# Patient Record
Sex: Female | Born: 1940
Health system: Southern US, Community
[De-identification: ages and names within clinical notes are randomized; demographics above are authoritative.]

## PROBLEM LIST (undated history)

## (undated) DIAGNOSIS — I10 Essential (primary) hypertension: Secondary | ICD-10-CM

## (undated) DIAGNOSIS — K52831 Collagenous colitis: Secondary | ICD-10-CM

## (undated) DIAGNOSIS — C439 Malignant melanoma of skin, unspecified: Secondary | ICD-10-CM

## (undated) DIAGNOSIS — D039 Melanoma in situ, unspecified: Secondary | ICD-10-CM

## (undated) HISTORY — DX: Essential (primary) hypertension: I10

## (undated) HISTORY — DX: Malignant melanoma of skin, unspecified: C43.9

## (undated) HISTORY — PX: BREAST BIOPSY: SHX20

## (undated) HISTORY — DX: Collagenous colitis: K52.831

## (undated) HISTORY — DX: Melanoma in situ, unspecified: D03.9

## (undated) HISTORY — PX: MELANOMA EXCISION: SHX5266

## (undated) HISTORY — PX: TONSILLECTOMY: SUR1361

---

## 1978-07-25 HISTORY — PX: STRABISMUS SURGERY: SHX218

## 2003-07-26 HISTORY — PX: OTHER SURGICAL HISTORY: SHX169

## 2008-03-17 DIAGNOSIS — D039 Melanoma in situ, unspecified: Secondary | ICD-10-CM

## 2008-03-17 HISTORY — DX: Melanoma in situ, unspecified: D03.9

## 2008-08-04 DIAGNOSIS — C4491 Basal cell carcinoma of skin, unspecified: Secondary | ICD-10-CM

## 2008-08-04 HISTORY — DX: Basal cell carcinoma of skin, unspecified: C44.91

## 2013-08-29 DIAGNOSIS — D239 Other benign neoplasm of skin, unspecified: Secondary | ICD-10-CM

## 2013-08-29 HISTORY — DX: Other benign neoplasm of skin, unspecified: D23.9

## 2014-09-23 LAB — HM COLONOSCOPY

## 2015-01-23 HISTORY — PX: CARDIOVASCULAR STRESS TEST: SHX262

## 2016-03-23 ENCOUNTER — Encounter: Payer: Self-pay | Admitting: Internal Medicine

## 2016-03-23 ENCOUNTER — Ambulatory Visit (INDEPENDENT_AMBULATORY_CARE_PROVIDER_SITE_OTHER): Payer: Medicare PPO | Admitting: Internal Medicine

## 2016-03-23 VITALS — BP 160/98 | HR 71 | Temp 97.7°F | Ht 64.0 in | Wt 161.0 lb

## 2016-03-23 DIAGNOSIS — Z7189 Other specified counseling: Secondary | ICD-10-CM | POA: Insufficient documentation

## 2016-03-23 DIAGNOSIS — Z85820 Personal history of malignant melanoma of skin: Secondary | ICD-10-CM

## 2016-03-23 DIAGNOSIS — I10 Essential (primary) hypertension: Secondary | ICD-10-CM | POA: Insufficient documentation

## 2016-03-23 LAB — COMPREHENSIVE METABOLIC PANEL
ALK PHOS: 68 U/L (ref 39–117)
ALT: 24 U/L (ref 0–35)
AST: 27 U/L (ref 0–37)
Albumin: 4.6 g/dL (ref 3.5–5.2)
BILIRUBIN TOTAL: 0.9 mg/dL (ref 0.2–1.2)
BUN: 15 mg/dL (ref 6–23)
CO2: 30 meq/L (ref 19–32)
CREATININE: 0.59 mg/dL (ref 0.40–1.20)
Calcium: 10 mg/dL (ref 8.4–10.5)
Chloride: 92 mEq/L — ABNORMAL LOW (ref 96–112)
GFR: 105 mL/min (ref >60.00)
GLUCOSE: 96 mg/dL (ref 70–99)
Potassium: 4.1 mEq/L (ref 3.5–5.1)
Sodium: 128 mEq/L — ABNORMAL LOW (ref 135–145)
Total Protein: 8.1 g/dL (ref 6.0–8.3)

## 2016-03-23 LAB — CBC WITH DIFFERENTIAL/PLATELET
BASOS ABS: 0 10*3/uL (ref 0.0–0.1)
Basophils Relative: 0.4 % (ref 0.0–3.0)
EOS ABS: 0.1 10*3/uL (ref 0.0–0.7)
Eosinophils Relative: 1.3 % (ref 0.0–5.0)
HEMATOCRIT: 40.3 % (ref 36.0–46.0)
HEMOGLOBIN: 13.5 g/dL (ref 12.0–15.0)
LYMPHS PCT: 29.2 % (ref 12.0–46.0)
Lymphs Abs: 1.8 10*3/uL (ref 0.7–4.0)
MCHC: 33.6 g/dL (ref 30.0–36.0)
MCV: 87 fl (ref 78.0–100.0)
MONOS PCT: 13.4 % — AB (ref 3.0–12.0)
Monocytes Absolute: 0.8 10*3/uL (ref 0.1–1.0)
NEUTROS ABS: 3.4 10*3/uL (ref 1.4–7.7)
Neutrophils Relative %: 55.7 % (ref 43.0–77.0)
PLATELETS: 374 10*3/uL (ref 150.0–400.0)
RBC: 4.63 Mil/uL (ref 3.87–5.11)
RDW: 16 % — ABNORMAL HIGH (ref 11.5–15.5)
WBC: 6.1 10*3/uL (ref 4.0–10.5)

## 2016-03-23 LAB — LIPID PANEL
CHOL/HDL RATIO: 3
CHOLESTEROL: 261 mg/dL — AB (ref 0–200)
HDL: 102.7 mg/dL (ref >39.00)
LDL CALC: 144 mg/dL — AB (ref 0–99)
NonHDL: 158.12
TRIGLYCERIDES: 72 mg/dL (ref 0.0–149.0)
VLDL: 14.4 mg/dL (ref 0.0–40.0)

## 2016-03-23 LAB — MICROALBUMIN / CREATININE URINE RATIO
CREATININE, U: 30.2 mg/dL
MICROALB/CREAT RATIO: 7.6 mg/g (ref 0.0–30.0)
Microalb, Ur: 2.3 mg/dL — ABNORMAL HIGH (ref 0.0–1.9)

## 2016-03-23 LAB — T4, FREE: Free T4: 0.85 ng/dL (ref 0.60–1.60)

## 2016-03-23 MED ORDER — TETANUS-DIPHTHERIA TOXOIDS TD 5-2 LFU IM INJ: 0.5000 mL | INJECTION | Freq: Once | INTRAMUSCULAR | 0 refills | Status: AC

## 2016-03-23 MED ORDER — METOPROLOL SUCCINATE ER 25 MG PO TB24: 25.0000 mg | ORAL_TABLET | Freq: Every day | ORAL | 3 refills | Status: DC

## 2016-03-23 MED ORDER — AMLODIPINE BESYLATE 5 MG PO TABS: 5.0000 mg | ORAL_TABLET | Freq: Every day | ORAL | 3 refills | Status: DC

## 2016-03-23 MED ORDER — LOSARTAN POTASSIUM-HCTZ 100-25 MG PO TABS: 1.0000 | ORAL_TABLET | Freq: Every day | ORAL | 3 refills | Status: DC

## 2016-03-23 NOTE — Assessment & Plan Note (Signed)
Keeps up with dermatologist

## 2016-03-23 NOTE — Progress Notes (Signed)
Pre visit review using our clinic review tool, if applicable. No additional management support is needed unless otherwise documented below in the visit note. 

## 2016-03-23 NOTE — Progress Notes (Signed)
Subjective:    Patient ID: Barbara Murphy, female    DOB: Jul 24, 1941, 75 y.o.   MRN: [id number]  HPI Here with husband to establish Moved to [facility] a few months ago  Most concerning history is past melanoma Has had a few Keeps up with dermatologist regularly  Long standing HTN Usually well controlled BP usually fine--like when giving blood Feels head is full--congested  No current outpatient prescriptions on file prior to visit.   No current facility-administered medications on file prior to visit.     Allergies  Allergen Reactions  . Sulfa Antibiotics Hives    Past Medical History:  Diagnosis Date  . Hypertension   . Melanoma [facility])     Past Surgical History:  Procedure Laterality Date  . [place]   benign  . MELANOMA EXCISION     multiple--- [facility]  . TONSILLECTOMY      Family History  Problem Relation Age of Onset  . Cancer Mother     liver  . Parkinson's disease Father   . Diabetes Brother   . Heart disease Brother   . Stroke Sister   . Heart disease Sister     Social History   Social History  . Marital status: Married    Spouse name: N/A  . Number of children: 7  . Years of education: N/A   Occupational History  . 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 Smoking status: Former Smoker    Packs/day: 0.25    Types: Cigarettes    Quit date: 07/25/1968  . Smokeless tobacco: Never Used  . Alcohol use Not on file  . Drug use: Unknown  . Sexual activity: Not on file   Other Topics Concern  . Not on file   Social History Narrative   Second marriage-- 1980   2 children, 5 stepchildren   Multiple grandchildren and great grandchildren      Has living will   Husband has health care POA   Would accept resuscitation attempts   Would leave tube feeds up to husband   Review of Systems  Constitutional: Negative for fatigue and unexpected weight change.       Regular exercise  at [facility] Wears seat belt  HENT: Negative for dental problem and hearing loss.        Partial dentures Keeps up with dentist  Eyes: Negative for visual disturbance.       No diplopia or unilateral vision loss  Respiratory: Negative for cough, chest tightness and shortness of breath.   Cardiovascular: Negative for palpitations and leg swelling.       Chest pain last year--negative stress test, etc  Gastrointestinal: Negative for nausea and vomiting.       No heartburn Colitis spell ~12 years ago. (collagenous). occ diarrhea  Endocrine: Negative for polydipsia and polyuria.  Genitourinary: Negative for difficulty urinating, dysuria and hematuria.  Musculoskeletal: Negative for arthralgias, back pain and joint swelling.  Skin: Negative for rash.  Allergic/Immunologic: Negative for environmental allergies and immunocompromised state.  Neurological: Positive for headaches. Negative for dizziness, syncope and light-headedness.  Hematological: Negative for adenopathy. Bruises/bleeds easily.  Psychiatric/Behavioral: Negative for dysphoric mood and sleep disturbance. The patient is not nervous/anxious.        Objective:   Physical Exam  Constitutional: She appears well-developed and well-nourished. No distress.  HENT:  Mouth/Throat: Oropharynx is clear and moist. No oropharyngeal exudate.  Neck: Normal range of motion. Neck supple. No thyromegaly present.  Cardiovascular: Normal rate, regular rhythm and normal heart sounds.  Exam reveals no gallop.   No murmur heard. Pulmonary/Chest: Effort normal and breath sounds normal. No respiratory distress. She has no wheezes. She has no rales.  Abdominal: Soft. There is no tenderness.  Musculoskeletal: She exhibits no edema or tenderness.  Lymphadenopathy:    She has no cervical adenopathy.  Skin: No rash noted. No erythema.  Psychiatric: She has a normal mood and affect. Her behavior is normal.          Assessment & Plan:

## 2016-03-23 NOTE — Assessment & Plan Note (Signed)
See social history 

## 2016-03-23 NOTE — Assessment & Plan Note (Signed)
BP Readings from Last 3 Encounters:  03/23/16 (!) 160/98   Repeat 176/90 on right Recent check at [facility]-- 140/80 She got [id number] systolic this morning Didn't tolerate higher dose of amlodipine Will add low dose metoprolol and see back soon

## 2016-03-23 NOTE — Patient Instructions (Signed)
Check your blood pressure once or twice a week. Let me know if it is over 160/95.

## 2016-03-29 DIAGNOSIS — X32XXXA Exposure to sunlight, initial encounter: Secondary | ICD-10-CM | POA: Diagnosis not present

## 2016-03-29 DIAGNOSIS — S40261A Insect bite (nonvenomous) of right shoulder, initial encounter: Secondary | ICD-10-CM | POA: Diagnosis not present

## 2016-03-29 DIAGNOSIS — L821 Other seborrheic keratosis: Secondary | ICD-10-CM | POA: Diagnosis not present

## 2016-03-29 DIAGNOSIS — Z08 Encounter for follow-up examination after completed treatment for malignant neoplasm: Secondary | ICD-10-CM | POA: Diagnosis not present

## 2016-03-29 DIAGNOSIS — Z7189 Other specified counseling: Secondary | ICD-10-CM | POA: Diagnosis not present

## 2016-03-29 DIAGNOSIS — L570 Actinic keratosis: Secondary | ICD-10-CM | POA: Diagnosis not present

## 2016-03-29 DIAGNOSIS — Z85820 Personal history of malignant melanoma of skin: Secondary | ICD-10-CM | POA: Diagnosis not present

## 2016-03-29 DIAGNOSIS — L820 Inflamed seborrheic keratosis: Secondary | ICD-10-CM | POA: Diagnosis not present

## 2016-03-29 DIAGNOSIS — Z85828 Personal history of other malignant neoplasm of skin: Secondary | ICD-10-CM | POA: Diagnosis not present

## 2016-03-29 DIAGNOSIS — D485 Neoplasm of uncertain behavior of skin: Secondary | ICD-10-CM | POA: Diagnosis not present

## 2016-04-13 ENCOUNTER — Telehealth: Payer: Self-pay

## 2016-04-13 NOTE — Telephone Encounter (Signed)
Pt seen 03/23/16 and started metoprolol; pt not sleeping as well and pt is very low key now so pt's personality has changed. Pt wants to know if she can stop the metoprolol and watch her diet more closely and exercise and see if that will help BP. pts BP yesterday was 138/77. Pt request cb.

## 2016-04-13 NOTE — Telephone Encounter (Signed)
Spoke to pt. She has an appt with [place] next Friday. She will keep a BP record and let us know who it is at that visit

## 2016-04-13 NOTE — Telephone Encounter (Signed)
Okay Have her monitor her BP and let me know if it consistently is over 150/90

## 2016-04-22 ENCOUNTER — Encounter: Payer: Self-pay | Admitting: Internal Medicine

## 2016-04-22 ENCOUNTER — Ambulatory Visit (INDEPENDENT_AMBULATORY_CARE_PROVIDER_SITE_OTHER): Payer: Medicare PPO | Admitting: Internal Medicine

## 2016-04-22 DIAGNOSIS — I10 Essential (primary) hypertension: Secondary | ICD-10-CM | POA: Diagnosis not present

## 2016-04-22 DIAGNOSIS — M542 Cervicalgia: Secondary | ICD-10-CM | POA: Insufficient documentation

## 2016-04-22 NOTE — Assessment & Plan Note (Signed)
She will try heat and massage (seems muscular) PT referral if persists

## 2016-04-22 NOTE — Assessment & Plan Note (Signed)
BP Readings from Last 3 Encounters:  04/22/16 138/82  03/23/16 (!) 160/98   Better now Didn't tolerate the metoprolol No new meds for now

## 2016-04-22 NOTE — Progress Notes (Signed)
Pre visit review using our clinic review tool, if applicable. No additional management support is needed unless otherwise documented below in the visit note. 

## 2016-04-22 NOTE — Progress Notes (Signed)
Subjective:    Patient ID: Barbara Murphy, female    DOB: 1941/01/08, 75 y.o.   MRN: [id number]  HPI Here for follow up of HTN Didn't tolerate the metoprolol--personality change and fatigue  Has increased her fitness efforts Eating better More exercise classes Monitoring BP at home--higher than at the nurse Since stopping metoprolol-- 124/80 at nurse--- 133/78-153/83 at home  No chest pain No SOB No dizziness since stopping the metoprolol No edema  Ongoing issues with her neck "Alicia Amel" that may go back to past injury Massage seems to help Ibuprofen 200 mg and icy hot both help  Current Outpatient Prescriptions on File Prior to Visit  Medication Sig Dispense Refill  . Alpha-Lipoic Acid 200 MG CAPS Take 1 capsule by mouth daily.    Marland Kitchen amLODipine (NORVASC) 5 MG tablet Take 1 tablet (5 mg total) by mouth daily. 90 tablet 3  . aspirin 81 MG tablet Take 81 mg by mouth daily.    . Calcium Carbonate (CALCIUM 600 PO) Take 1 tablet by mouth daily.    . Coenzyme Q10 200 MG capsule Take 200 mg by mouth daily.    Marland Kitchen losartan-hydrochlorothiazide (HYZAAR) 100-25 MG tablet Take 1 tablet by mouth daily. 90 tablet 3  . Lutein 20 MG TABS Take 1 tablet by mouth daily.    . Magnesium 200 MG TABS Take 1 tablet by mouth daily.    . Multiple Vitamin (MULTIVITAMIN) tablet Take 1 tablet by mouth daily.    . Omega-3 Fatty Acids (FISH OIL) 1000 MG CAPS Take 1 capsule by mouth daily.    . Probiotic Product (PROBIOTIC PO) Take 1 capsule by mouth daily.     No current facility-administered medications on file prior to visit.     Allergies  Allergen Reactions  . Sulfa Antibiotics Hives  . Metoprolol Other (See Comments)    Personality change, fatigue    Past Medical History:  Diagnosis Date  . Hypertension   . Melanoma [facility])     Past Surgical History:  Procedure Laterality Date  . [place]   benign  . MELANOMA EXCISION     multiple--- [facility]  .  STRABISMUS SURGERY  1980  . TONSILLECTOMY      Family History  Problem Relation Age of Onset  . Cancer Mother     liver  . Parkinson's disease Father   . Diabetes Brother   . Heart disease Brother   . Stroke Sister   . Heart disease Sister     Social History   Social History  . Marital status: Married    Spouse name: N/A  . Number of children: 7  . Years of education: N/A   Occupational History  . 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 Smoking status: Former Smoker    Packs/day: 0.25    Types: Cigarettes    Quit date: 07/25/1968  . Smokeless tobacco: Never Used  . Alcohol use Not on file  . Drug use: Unknown  . Sexual activity: Not on file   Other Topics Concern  . Not on file   Social History Narrative   Second marriage-- 1980   2 children, 5 stepchildren   Multiple grandchildren and great grandchildren      Has living will   Husband has health care POA   Would accept resuscitation attempts   Would leave tube feeds up to husband   Review of Systems  Appetite is fine Sleep is never great--- at most 6-7 hours per night     Objective:   Physical Exam  Constitutional: She appears well-developed. No distress.  Neck:  Some restriction in extension and lateral rotation Tightness in traps  Cardiovascular: Normal rate, regular rhythm and normal heart sounds.  Exam reveals no gallop.   No murmur heard. Pulmonary/Chest: Effort normal and breath sounds normal. No respiratory distress. She has no wheezes. She has no rales.  Neurological:  No arm weakness          Assessment & Plan:

## 2016-05-19 ENCOUNTER — Telehealth: Payer: Self-pay

## 2016-05-19 DIAGNOSIS — M542 Cervicalgia: Secondary | ICD-10-CM

## 2016-05-19 NOTE — Telephone Encounter (Signed)
PCP had mentioned PT referral.  I put in for ortho per patient request.

## 2016-05-19 NOTE — Telephone Encounter (Signed)
Pt left v/m; Dr Silvio Pate had recommended massage therapy for neck pain. Neck is no better and pt request ortho referral. Pt last seen 04/22/16. Dr Silvio Pate out of office.Please advise.

## 2016-05-30 DIAGNOSIS — M542 Cervicalgia: Secondary | ICD-10-CM | POA: Diagnosis not present

## 2016-05-30 DIAGNOSIS — S46819A Strain of other muscles, fascia and tendons at shoulder and upper arm level, unspecified arm, initial encounter: Secondary | ICD-10-CM | POA: Diagnosis not present

## 2016-06-06 DIAGNOSIS — M542 Cervicalgia: Secondary | ICD-10-CM | POA: Diagnosis not present

## 2016-06-09 DIAGNOSIS — M542 Cervicalgia: Secondary | ICD-10-CM | POA: Diagnosis not present

## 2016-06-13 DIAGNOSIS — M542 Cervicalgia: Secondary | ICD-10-CM | POA: Diagnosis not present

## 2016-06-14 DIAGNOSIS — X32XXXA Exposure to sunlight, initial encounter: Secondary | ICD-10-CM | POA: Diagnosis not present

## 2016-06-14 DIAGNOSIS — M542 Cervicalgia: Secondary | ICD-10-CM | POA: Diagnosis not present

## 2016-06-14 DIAGNOSIS — L570 Actinic keratosis: Secondary | ICD-10-CM | POA: Diagnosis not present

## 2016-06-22 DIAGNOSIS — M542 Cervicalgia: Secondary | ICD-10-CM | POA: Diagnosis not present

## 2016-06-24 DIAGNOSIS — M542 Cervicalgia: Secondary | ICD-10-CM | POA: Diagnosis not present

## 2016-06-29 DIAGNOSIS — M542 Cervicalgia: Secondary | ICD-10-CM | POA: Diagnosis not present

## 2016-07-12 DIAGNOSIS — M542 Cervicalgia: Secondary | ICD-10-CM | POA: Diagnosis not present

## 2016-07-15 DIAGNOSIS — M542 Cervicalgia: Secondary | ICD-10-CM | POA: Diagnosis not present

## 2016-08-02 DIAGNOSIS — M542 Cervicalgia: Secondary | ICD-10-CM | POA: Diagnosis not present

## 2016-08-02 DIAGNOSIS — S46819A Strain of other muscles, fascia and tendons at shoulder and upper arm level, unspecified arm, initial encounter: Secondary | ICD-10-CM | POA: Diagnosis not present

## 2016-09-27 DIAGNOSIS — D225 Melanocytic nevi of trunk: Secondary | ICD-10-CM | POA: Diagnosis not present

## 2016-09-27 DIAGNOSIS — D485 Neoplasm of uncertain behavior of skin: Secondary | ICD-10-CM | POA: Diagnosis not present

## 2016-09-27 DIAGNOSIS — Z85820 Personal history of malignant melanoma of skin: Secondary | ICD-10-CM | POA: Diagnosis not present

## 2016-09-27 DIAGNOSIS — Z08 Encounter for follow-up examination after completed treatment for malignant neoplasm: Secondary | ICD-10-CM | POA: Diagnosis not present

## 2016-09-27 DIAGNOSIS — X32XXXA Exposure to sunlight, initial encounter: Secondary | ICD-10-CM | POA: Diagnosis not present

## 2016-09-27 DIAGNOSIS — L570 Actinic keratosis: Secondary | ICD-10-CM | POA: Diagnosis not present

## 2016-09-27 DIAGNOSIS — Z85828 Personal history of other malignant neoplasm of skin: Secondary | ICD-10-CM | POA: Diagnosis not present

## 2016-09-27 DIAGNOSIS — Z872 Personal history of diseases of the skin and subcutaneous tissue: Secondary | ICD-10-CM | POA: Diagnosis not present

## 2016-09-27 DIAGNOSIS — L821 Other seborrheic keratosis: Secondary | ICD-10-CM | POA: Diagnosis not present

## 2017-01-14 ENCOUNTER — Other Ambulatory Visit: Payer: Self-pay | Admitting: Internal Medicine

## 2017-04-04 DIAGNOSIS — S50811A Abrasion of right forearm, initial encounter: Secondary | ICD-10-CM | POA: Diagnosis not present

## 2017-04-04 DIAGNOSIS — D225 Melanocytic nevi of trunk: Secondary | ICD-10-CM | POA: Diagnosis not present

## 2017-04-04 DIAGNOSIS — L821 Other seborrheic keratosis: Secondary | ICD-10-CM | POA: Diagnosis not present

## 2017-04-04 DIAGNOSIS — X32XXXA Exposure to sunlight, initial encounter: Secondary | ICD-10-CM | POA: Diagnosis not present

## 2017-04-04 DIAGNOSIS — Z85820 Personal history of malignant melanoma of skin: Secondary | ICD-10-CM | POA: Diagnosis not present

## 2017-04-04 DIAGNOSIS — L905 Scar conditions and fibrosis of skin: Secondary | ICD-10-CM | POA: Diagnosis not present

## 2017-04-04 DIAGNOSIS — Z08 Encounter for follow-up examination after completed treatment for malignant neoplasm: Secondary | ICD-10-CM | POA: Diagnosis not present

## 2017-04-04 DIAGNOSIS — L570 Actinic keratosis: Secondary | ICD-10-CM | POA: Diagnosis not present

## 2017-04-04 DIAGNOSIS — Z85828 Personal history of other malignant neoplasm of skin: Secondary | ICD-10-CM | POA: Diagnosis not present

## 2017-04-28 ENCOUNTER — Encounter: Payer: Self-pay | Admitting: Internal Medicine

## 2017-04-28 ENCOUNTER — Ambulatory Visit (INDEPENDENT_AMBULATORY_CARE_PROVIDER_SITE_OTHER): Payer: Medicare PPO | Admitting: Internal Medicine

## 2017-04-28 VITALS — BP 138/82 | HR 70 | Temp 98.1°F | Ht 64.0 in | Wt 153.8 lb

## 2017-04-28 DIAGNOSIS — I10 Essential (primary) hypertension: Secondary | ICD-10-CM

## 2017-04-28 DIAGNOSIS — Z7189 Other specified counseling: Secondary | ICD-10-CM

## 2017-04-28 DIAGNOSIS — Z0000 Encounter for general adult medical examination without abnormal findings: Secondary | ICD-10-CM | POA: Diagnosis not present

## 2017-04-28 DIAGNOSIS — M542 Cervicalgia: Secondary | ICD-10-CM | POA: Diagnosis not present

## 2017-04-28 DIAGNOSIS — Z85820 Personal history of malignant melanoma of skin: Secondary | ICD-10-CM

## 2017-04-28 DIAGNOSIS — Z0001 Encounter for general adult medical examination with abnormal findings: Secondary | ICD-10-CM | POA: Insufficient documentation

## 2017-04-28 DIAGNOSIS — Z23 Encounter for immunization: Secondary | ICD-10-CM | POA: Diagnosis not present

## 2017-04-28 LAB — CBC WITH DIFFERENTIAL/PLATELET
BASOS ABS: 0 10*3/uL (ref 0.0–0.1)
BASOS PCT: 0.1 % (ref 0.0–3.0)
EOS ABS: 0.1 10*3/uL (ref 0.0–0.7)
Eosinophils Relative: 1.3 % (ref 0.0–5.0)
HEMATOCRIT: 39.6 % (ref 36.0–46.0)
Hemoglobin: 13.1 g/dL (ref 12.0–15.0)
LYMPHS ABS: 1.5 10*3/uL (ref 0.7–4.0)
Lymphocytes Relative: 38.4 % (ref 12.0–46.0)
MCHC: 33.2 g/dL (ref 30.0–36.0)
MCV: 86.4 fl (ref 78.0–100.0)
MONO ABS: 0.6 10*3/uL (ref 0.1–1.0)
Monocytes Relative: 14.1 % — ABNORMAL HIGH (ref 3.0–12.0)
NEUTROS ABS: 1.8 10*3/uL (ref 1.4–7.7)
NEUTROS PCT: 46.1 % (ref 43.0–77.0)
PLATELETS: 368 10*3/uL (ref 150.0–400.0)
RBC: 4.58 Mil/uL (ref 3.87–5.11)
RDW: 14.7 % (ref 11.5–15.5)
WBC: 4 10*3/uL (ref 4.0–10.5)

## 2017-04-28 LAB — COMPREHENSIVE METABOLIC PANEL
ALT: 17 U/L (ref 0–35)
AST: 21 U/L (ref 0–37)
Albumin: 4.5 g/dL (ref 3.5–5.2)
Alkaline Phosphatase: 49 U/L (ref 39–117)
BILIRUBIN TOTAL: 1 mg/dL (ref 0.2–1.2)
BUN: 10 mg/dL (ref 6–23)
CALCIUM: 9.5 mg/dL (ref 8.4–10.5)
CHLORIDE: 92 meq/L — AB (ref 96–112)
CO2: 29 meq/L (ref 19–32)
CREATININE: 0.54 mg/dL (ref 0.40–1.20)
GFR: 115.97 mL/min (ref >60.00)
GLUCOSE: 97 mg/dL (ref 70–99)
Potassium: 4.6 mEq/L (ref 3.5–5.1)
SODIUM: 127 meq/L — AB (ref 135–145)
Total Protein: 7.7 g/dL (ref 6.0–8.3)

## 2017-04-28 NOTE — Progress Notes (Signed)
Subjective:    Patient ID: Barbara Murphy, female    DOB: 1940/10/19, 76 y.o.   MRN: [id number]  HPI Here with husband for Medicare wellness visit and follow up of chronic health conditons Reviewed form and advanced directives Reviewed other doctors Enjoys 1-2 glasses of wine a night No tobacco Exercises regularly  No vision problems No problems with hearing No falls No depression or anhedonia Independent with instrumental ADLs No significant memory issues  Still having stiff and sore neck Tried massage, then ortho with therapy Chiropractor not much help-- acupuncture Started tumeric--- notices some improvement  Has changed diet--cut out a lot of sugar and carbohydrates Weight is down some No trouble with BP meds 128/82 when checked at [facility] No chest pain No SOB No dizziness or syncope No edema No headaches  Keeps up with dermatology At least twice a year No new problems  Current Outpatient Prescriptions on File Prior to Visit  Medication Sig Dispense Refill  . Alpha-Lipoic Acid 200 MG CAPS Take 1 capsule by mouth daily.    Marland Kitchen amLODipine (NORVASC) 5 MG tablet TAKE 1 TABLET (5 MG TOTAL) BY MOUTH DAILY. 90 tablet 1  . aspirin 81 MG tablet Take 81 mg by mouth daily.    . Calcium Carbonate (CALCIUM 600 PO) Take 1 tablet by mouth daily.    . Coenzyme Q10 200 MG capsule Take 200 mg by mouth daily.    Marland Kitchen losartan-hydrochlorothiazide (HYZAAR) 100-25 MG tablet TAKE 1 TABLET EVERY DAY 90 tablet 1  . Lutein 20 MG TABS Take 1 tablet by mouth daily.    . Magnesium 200 MG TABS Take 1 tablet by mouth daily.    . Multiple Vitamin (MULTIVITAMIN) tablet Take 1 tablet by mouth daily.    . Omega-3 Fatty Acids (FISH OIL) 1000 MG CAPS Take 1 capsule by mouth daily.    . Probiotic Product (PROBIOTIC PO) Take 1 capsule by mouth daily.    . Vitamin D, Cholecalciferol, 1000 units CAPS Take 2 capsules by mouth.     No current facility-administered medications on file prior to visit.      Allergies  Allergen Reactions  . Sulfa Antibiotics Hives  . Metoprolol Other (See Comments)    Personality change, fatigue    Past Medical History:  Diagnosis Date  . Hypertension   . Melanoma [facility])     Past Surgical History:  Procedure Laterality Date  . [place]   benign  . CARDIOVASCULAR STRESS TEST  01/2015   Nuclear---negative  . DEXA  2005   normal  . MELANOMA EXCISION     multiple--- [facility]  . STRABISMUS SURGERY  1980  . TONSILLECTOMY      Family History  Problem Relation Age of Onset  . Cancer Mother        liver  . Parkinson's disease Father   . Diabetes Brother   . Heart disease Brother   . Stroke Sister   . Heart disease Sister     Social History   Social History  . Marital status: Married    Spouse name: N/A  . Number of children: 7  . Years of education: N/A   Occupational History  . 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 Smoking status: Former Smoker    Packs/day: 0.25    Types: Cigarettes    Quit date: 07/25/1968  . Smokeless tobacco: Never Used  . Alcohol  use Not on file  . Drug use: Unknown  . Sexual activity: Not on file   Other Topics Concern  . Not on file   Social History Narrative   Second marriage-- 1980   2 children, 5 stepchildren   Multiple grandchildren and great grandchildren      Has living will   Husband has health care POA--- son Gerald Stabs is alternate   Would accept resuscitation attempts   Would leave tube feeds up to husband   Review of Systems Appetite is good Generally sleeps well Wears seat belt Teeth are okay--keeps up with dentist No back or joint pain Bowels are fine--no blood No breast masses Voids okay. No incontinence No heartburn or dysphagia    Objective:   Physical Exam  Constitutional: She is oriented to person, place, and time. She appears well-nourished. No distress.  HENT:  Mouth/Throat: Oropharynx is clear and  moist. No oropharyngeal exudate.  Neck: No thyromegaly present.  Stiff and limited ROM  Cardiovascular: Normal rate, regular rhythm, normal heart sounds and intact distal pulses.  Exam reveals no gallop.   No murmur heard. Pulmonary/Chest: Effort normal and breath sounds normal. No respiratory distress. She has no wheezes. She has no rales.  Abdominal: Soft. There is no tenderness.  Musculoskeletal: She exhibits no edema or tenderness.  Lymphadenopathy:    She has no cervical adenopathy.  Neurological: She is alert and oriented to person, place, and time.  President--- "Dwaine Deter, Clinton---- Bush" 100-93-86-79-72-65 D-l-r-o-w Recall 3/3  Skin: No rash noted. No erythema.  Psychiatric: She has a normal mood and affect. Her behavior is normal.          Assessment & Plan:

## 2017-04-28 NOTE — Assessment & Plan Note (Signed)
See social history 

## 2017-04-28 NOTE — Assessment & Plan Note (Signed)
Working with various modalities Tumeric may be helping

## 2017-04-28 NOTE — Assessment & Plan Note (Signed)
I have personally reviewed the Medicare Annual Wellness questionnaire and have noted 1. The patient's medical and social history 2. Their use of alcohol, tobacco or illicit drugs 3. Their current medications and supplements 4. The patient's functional ability including ADL's, fall risks, home safety risks and hearing or visual             impairment. 5. Diet and physical activities 6. Evidence for depression or mood disorders  The patients weight, height, BMI and visual acuity have been recorded in the chart I have made referrals, counseling and provided education to the patient based review of the above and I have provided the pt with a written personalized care plan for preventive services.  I have provided you with a copy of your personalized plan for preventive services. Please take the time to review along with your updated medication list.  Discussed cancer screening --will hold off Will consider shingrix Flu vaccine today Exercises regularly

## 2017-04-28 NOTE — Assessment & Plan Note (Signed)
BP Readings from Last 3 Encounters:  04/28/17 138/82  04/22/16 138/82  03/23/16 (!) 160/98   Good control No changes

## 2017-04-28 NOTE — Assessment & Plan Note (Signed)
Keeps up with derm

## 2017-06-09 DIAGNOSIS — M542 Cervicalgia: Secondary | ICD-10-CM | POA: Diagnosis not present

## 2017-06-28 DIAGNOSIS — R600 Localized edema: Secondary | ICD-10-CM | POA: Diagnosis not present

## 2017-06-28 DIAGNOSIS — M50323 Other cervical disc degeneration at C6-C7 level: Secondary | ICD-10-CM | POA: Diagnosis not present

## 2017-06-28 DIAGNOSIS — M4854XD Collapsed vertebra, not elsewhere classified, thoracic region, subsequent encounter for fracture with routine healing: Secondary | ICD-10-CM | POA: Diagnosis not present

## 2017-06-28 DIAGNOSIS — M47812 Spondylosis without myelopathy or radiculopathy, cervical region: Secondary | ICD-10-CM | POA: Diagnosis not present

## 2017-07-11 DIAGNOSIS — M4802 Spinal stenosis, cervical region: Secondary | ICD-10-CM | POA: Diagnosis not present

## 2017-07-21 DIAGNOSIS — M5030 Other cervical disc degeneration, unspecified cervical region: Secondary | ICD-10-CM | POA: Diagnosis not present

## 2017-08-02 DIAGNOSIS — M47812 Spondylosis without myelopathy or radiculopathy, cervical region: Secondary | ICD-10-CM | POA: Diagnosis not present

## 2017-09-07 DIAGNOSIS — M47812 Spondylosis without myelopathy or radiculopathy, cervical region: Secondary | ICD-10-CM | POA: Diagnosis not present

## 2017-09-28 ENCOUNTER — Telehealth: Payer: Self-pay | Admitting: Internal Medicine

## 2017-09-28 MED ORDER — LOSARTAN POTASSIUM-HCTZ 100-25 MG PO TABS: 1.0000 | ORAL_TABLET | Freq: Every day | ORAL | 1 refills | Status: DC

## 2017-09-28 MED ORDER — AMLODIPINE BESYLATE 5 MG PO TABS: 5.0000 mg | ORAL_TABLET | Freq: Every day | ORAL | 1 refills | Status: DC

## 2017-09-28 NOTE — Telephone Encounter (Signed)
Copied from [place] [id number]. Topic: Quick Communication - See Telephone Encounter >> Sep 28, 2017  1:59 PM Conception Chancy, NT wrote: CRM for notification. See Telephone encounter for:  09/28/17.  Patient is calling stating she needs a refill on amLODipine (NORVASC) 5 MG tablet and losartan-hydrochlorothiazide (HYZAAR) 100-25 MG tablet. Please advise.  [place]  [place] [address]  Phone: [phone] Fax: [phone]

## 2017-12-27 DIAGNOSIS — D225 Melanocytic nevi of trunk: Secondary | ICD-10-CM | POA: Diagnosis not present

## 2017-12-27 DIAGNOSIS — Z85820 Personal history of malignant melanoma of skin: Secondary | ICD-10-CM | POA: Diagnosis not present

## 2017-12-27 DIAGNOSIS — L821 Other seborrheic keratosis: Secondary | ICD-10-CM | POA: Diagnosis not present

## 2017-12-27 DIAGNOSIS — Z08 Encounter for follow-up examination after completed treatment for malignant neoplasm: Secondary | ICD-10-CM | POA: Diagnosis not present

## 2017-12-27 DIAGNOSIS — Z85828 Personal history of other malignant neoplasm of skin: Secondary | ICD-10-CM | POA: Diagnosis not present

## 2017-12-27 DIAGNOSIS — Z872 Personal history of diseases of the skin and subcutaneous tissue: Secondary | ICD-10-CM | POA: Diagnosis not present

## 2018-01-18 DIAGNOSIS — H50112 Monocular exotropia, left eye: Secondary | ICD-10-CM | POA: Diagnosis not present

## 2018-01-18 DIAGNOSIS — H353132 Nonexudative age-related macular degeneration, bilateral, intermediate dry stage: Secondary | ICD-10-CM | POA: Diagnosis not present

## 2018-01-18 DIAGNOSIS — Z83518 Family history of other specified eye disorder: Secondary | ICD-10-CM | POA: Diagnosis not present

## 2018-01-18 DIAGNOSIS — Z9842 Cataract extraction status, left eye: Secondary | ICD-10-CM | POA: Diagnosis not present

## 2018-01-18 DIAGNOSIS — H53043 Amblyopia suspect, bilateral: Secondary | ICD-10-CM | POA: Diagnosis not present

## 2018-01-18 DIAGNOSIS — Z9841 Cataract extraction status, right eye: Secondary | ICD-10-CM | POA: Diagnosis not present

## 2018-01-18 DIAGNOSIS — H52213 Irregular astigmatism, bilateral: Secondary | ICD-10-CM | POA: Diagnosis not present

## 2018-04-30 ENCOUNTER — Other Ambulatory Visit: Payer: Self-pay | Admitting: Internal Medicine

## 2018-05-04 ENCOUNTER — Encounter: Payer: Medicare PPO | Admitting: Internal Medicine

## 2018-05-15 ENCOUNTER — Encounter: Payer: Self-pay | Admitting: Internal Medicine

## 2018-05-15 ENCOUNTER — Ambulatory Visit (INDEPENDENT_AMBULATORY_CARE_PROVIDER_SITE_OTHER): Payer: Medicare PPO | Admitting: Internal Medicine

## 2018-05-15 VITALS — BP 124/80 | HR 67 | Temp 97.7°F | Ht 64.0 in | Wt 152.0 lb

## 2018-05-15 DIAGNOSIS — Z0000 Encounter for general adult medical examination without abnormal findings: Secondary | ICD-10-CM | POA: Diagnosis not present

## 2018-05-15 DIAGNOSIS — I10 Essential (primary) hypertension: Secondary | ICD-10-CM | POA: Diagnosis not present

## 2018-05-15 DIAGNOSIS — Z7189 Other specified counseling: Secondary | ICD-10-CM

## 2018-05-15 DIAGNOSIS — Z23 Encounter for immunization: Secondary | ICD-10-CM | POA: Diagnosis not present

## 2018-05-15 LAB — COMPREHENSIVE METABOLIC PANEL
ALT: 18 U/L (ref 0–35)
AST: 23 U/L (ref 0–37)
Albumin: 4.5 g/dL (ref 3.5–5.2)
Alkaline Phosphatase: 62 U/L (ref 39–117)
BUN: 13 mg/dL (ref 6–23)
CHLORIDE: 93 meq/L — AB (ref 96–112)
CO2: 30 meq/L (ref 19–32)
CREATININE: 0.59 mg/dL (ref 0.40–1.20)
Calcium: 9.5 mg/dL (ref 8.4–10.5)
GFR: 104.42 mL/min (ref >60.00)
Glucose, Bld: 101 mg/dL — ABNORMAL HIGH (ref 70–99)
Potassium: 4.7 mEq/L (ref 3.5–5.1)
SODIUM: 128 meq/L — AB (ref 135–145)
Total Bilirubin: 1.2 mg/dL (ref 0.2–1.2)
Total Protein: 7.5 g/dL (ref 6.0–8.3)

## 2018-05-15 LAB — CBC
HEMATOCRIT: 37.6 % (ref 36.0–46.0)
Hemoglobin: 12.7 g/dL (ref 12.0–15.0)
MCHC: 33.7 g/dL (ref 30.0–36.0)
MCV: 84.5 fl (ref 78.0–100.0)
Platelets: 344 10*3/uL (ref 150.0–400.0)
RBC: 4.44 Mil/uL (ref 3.87–5.11)
RDW: 14.9 % (ref 11.5–15.5)
WBC: 5 10*3/uL (ref 4.0–10.5)

## 2018-05-15 MED ORDER — AMLODIPINE BESYLATE 5 MG PO TABS: 5.0000 mg | ORAL_TABLET | Freq: Every day | ORAL | 3 refills | Status: DC

## 2018-05-15 NOTE — Progress Notes (Signed)
Hearing Screening   Method: Audiometry   125Hz  250Hz  500Hz  1000Hz  2000Hz  3000Hz  4000Hz  6000Hz  8000Hz   Right ear:   25 40 25  25    Left ear:   25 25 25  25     Vision Screening Comments: June 2019

## 2018-05-15 NOTE — Assessment & Plan Note (Signed)
I have personally reviewed the Medicare Annual Wellness questionnaire and have noted 1. The patient's medical and social history 2. Their use of alcohol, tobacco or illicit drugs 3. Their current medications and supplements 4. The patient's functional ability including ADL's, fall risks, home safety risks and hearing or visual             impairment. 5. Diet and physical activities 6. Evidence for depression or mood disorders  The patients weight, height, BMI and visual acuity have been recorded in the chart I have made referrals, counseling and provided education to the patient based review of the above and I have provided the pt with a written personalized care plan for preventive services.  I have provided you with a copy of your personalized plan for preventive services. Please take the time to review along with your updated medication list.  Flu vaccine today Will consider shingrix at pharmacy No more cancer screening due to age Discussed exercise

## 2018-05-15 NOTE — Assessment & Plan Note (Signed)
BP Readings from Last 3 Encounters:  05/15/18 124/80  04/28/17 138/82  04/22/16 138/82   Good control on meds Will check labs

## 2018-05-15 NOTE — Assessment & Plan Note (Signed)
See social history 

## 2018-05-15 NOTE — Progress Notes (Signed)
Subjective:    Patient ID: Barbara Murphy, female    DOB: 01/21/1941, 77 y.o.   MRN: [id number]  HPI Here for Medicare wellness visit and follow up of chronic health conditions Reviewed form and advanced directives Reviewed other doctors Glass of wine some days No tobacco Early MD---on AREDs 2 and sun protection Hearing is good [place] once in grocery store---slight cut on arm No depression or anhedonia Independent with instrumental ADLs No memory problems  Feels good--no new problems Got some injections from [facility] for her neck---this is bettter  Still on BP meds No chest pain or SOB No change in exercise tolerance No dizziness or syncope No palpitations No headaches No edema  Sees derm twice a year No recent lesions  Current Outpatient Medications on File Prior to Visit  Medication Sig Dispense Refill  . Alpha-Lipoic Acid 200 MG CAPS Take 1 capsule by mouth daily.    Marland Kitchen b complex vitamins tablet Take 1 tablet by mouth daily.    . Calcium Carbonate (CALCIUM 600 PO) Take 1 tablet by mouth daily.    . Coenzyme Q10 200 MG capsule Take 200 mg by mouth daily.    Marland Kitchen losartan-hydrochlorothiazide (HYZAAR) 100-25 MG tablet TAKE 1 TABLET EVERY DAY 90 tablet 1  . Magnesium 200 MG TABS Take 1 tablet by mouth daily.    . Multiple Vitamin (MULTIVITAMIN) tablet Take 1 tablet by mouth daily.    . Multiple Vitamins-Minerals (PRESERVISION AREDS 2 PO) Take by mouth.    . Omega-3 Fatty Acids (FISH OIL) 1000 MG CAPS Take 1 capsule by mouth daily.    . Probiotic Product (PROBIOTIC PO) Take 1 capsule by mouth daily.    . Turmeric 500 MG CAPS Take by mouth.    . Vitamin D, Cholecalciferol, 1000 units CAPS Take 2 capsules by mouth.     No current facility-administered medications on file prior to visit.     Allergies  Allergen Reactions  . Sulfa Antibiotics Hives  . Metoprolol Other (See Comments)    Personality change, fatigue    Past Medical History:  Diagnosis Date  .  Hypertension   . Melanoma [facility])     Past Surgical History:  Procedure Laterality Date  . [place]   benign  . CARDIOVASCULAR STRESS TEST  01/2015   Nuclear---negative  . DEXA  2005   normal  . MELANOMA EXCISION     multiple--- [facility]  . STRABISMUS SURGERY  1980  . TONSILLECTOMY      Family History  Problem Relation Age of Onset  . Cancer Mother        liver  . Parkinson's disease Father   . Diabetes Brother   . Heart disease Brother   . Stroke Sister   . Heart disease Sister     Social History   Socioeconomic History  . Marital status: Married    Spouse name: Not on file  . Number of children: 7  . Years of education: Not on file  . Highest education level: Not on file  Occupational History  . Occupation: Pharmacist, hospital-- [employer]    Comment: Retired  Scientific laboratory technician  . Financial resource strain: Not on file  . Food insecurity:    Worry: Not on file    Inability: Not on file  . Transportation needs:    Medical: Not on file    Non-medical: Not on file  Tobacco Use  . Smoking status: Former Smoker  Packs/day: 0.25    Types: Cigarettes    Last attempt to quit: 07/25/1968    Years since quitting: 49.8  . Smokeless tobacco: Never Used  Substance and Sexual Activity  . Alcohol use: Not on file  . Drug use: Not on file  . Sexual activity: Not on file  Lifestyle  . Physical activity:    Days per week: Not on file    Minutes per session: Not on file  . Stress: Not on file  Relationships  . Social connections:    Talks on phone: Not on file    Gets together: Not on file    Attends religious service: Not on file    Active member of club or organization: Not on file    Attends meetings of clubs or organizations: Not on file    Relationship status: Not on file  . Intimate partner violence:    Fear of current or ex partner: Not on file    Emotionally abused: Not on file    Physically abused: Not on file    Forced sexual  activity: Not on file  Other Topics Concern  . Not on file  Social History Narrative   Second marriage-- 1980   2 children, 5 stepchildren   Multiple grandchildren and great grandchildren      Has living will   Husband has health care POA--- son Gerald Stabs is alternate   Would accept resuscitation attempts   Would leave tube feeds up to husband   Review of Systems Sleeps well if she keeps to routine Appetite is fine Weight stable Wears seat belt Teeth okay---due for dental visit Rare heartburn--- no dysphagia Bowels normal--no blood No dysuria or hematuria. Urge incontinence is she waits too long. Occasional pad No sig joint or back pain Leans over kitchen sink---will cause some low back muscle pain    Objective:   Physical Exam  Constitutional: She is oriented to person, place, and time. She appears well-developed. No distress.  HENT:  Mouth/Throat: Oropharynx is clear and moist. No oropharyngeal exudate.  Neck: No thyromegaly present.  Cardiovascular: Normal rate, regular rhythm, normal heart sounds and intact distal pulses. Exam reveals no gallop.  No murmur heard. Respiratory: Effort normal and breath sounds normal. No respiratory distress. She has no wheezes. She has no rales.  GI: Soft. There is no tenderness.  Musculoskeletal: She exhibits no edema or tenderness.  Lymphadenopathy:    She has no cervical adenopathy.  Neurological: She is alert and oriented to person, place, and time.  President--- "Posey Pronto Bush" 100-93-86-79-72-65 D-l-r-o-w Recall 3/3  Skin: No rash noted. No erythema.  Psychiatric: She has a normal mood and affect. Her behavior is normal.           Assessment & Plan:

## 2018-07-04 DIAGNOSIS — Z08 Encounter for follow-up examination after completed treatment for malignant neoplasm: Secondary | ICD-10-CM | POA: Diagnosis not present

## 2018-07-04 DIAGNOSIS — D485 Neoplasm of uncertain behavior of skin: Secondary | ICD-10-CM | POA: Diagnosis not present

## 2018-07-04 DIAGNOSIS — Z85820 Personal history of malignant melanoma of skin: Secondary | ICD-10-CM | POA: Diagnosis not present

## 2018-07-04 DIAGNOSIS — S40911A Unspecified superficial injury of right shoulder, initial encounter: Secondary | ICD-10-CM | POA: Diagnosis not present

## 2018-07-04 DIAGNOSIS — D0362 Melanoma in situ of left upper limb, including shoulder: Secondary | ICD-10-CM | POA: Diagnosis not present

## 2018-07-04 DIAGNOSIS — D225 Melanocytic nevi of trunk: Secondary | ICD-10-CM | POA: Diagnosis not present

## 2018-07-04 DIAGNOSIS — Z872 Personal history of diseases of the skin and subcutaneous tissue: Secondary | ICD-10-CM | POA: Diagnosis not present

## 2018-07-04 DIAGNOSIS — Z85828 Personal history of other malignant neoplasm of skin: Secondary | ICD-10-CM | POA: Diagnosis not present

## 2018-07-04 DIAGNOSIS — L821 Other seborrheic keratosis: Secondary | ICD-10-CM | POA: Diagnosis not present

## 2018-07-25 DIAGNOSIS — D039 Melanoma in situ, unspecified: Secondary | ICD-10-CM

## 2018-07-25 HISTORY — DX: Melanoma in situ, unspecified: D03.9

## 2018-07-31 DIAGNOSIS — H353132 Nonexudative age-related macular degeneration, bilateral, intermediate dry stage: Secondary | ICD-10-CM | POA: Diagnosis not present

## 2018-08-15 DIAGNOSIS — L814 Other melanin hyperpigmentation: Secondary | ICD-10-CM | POA: Diagnosis not present

## 2018-08-15 DIAGNOSIS — L7682 Other postprocedural complications of skin and subcutaneous tissue: Secondary | ICD-10-CM | POA: Diagnosis not present

## 2018-08-15 DIAGNOSIS — D0362 Melanoma in situ of left upper limb, including shoulder: Secondary | ICD-10-CM | POA: Diagnosis not present

## 2018-08-21 ENCOUNTER — Encounter: Payer: Self-pay | Admitting: Internal Medicine

## 2018-08-29 DIAGNOSIS — Z48817 Encounter for surgical aftercare following surgery on the skin and subcutaneous tissue: Secondary | ICD-10-CM | POA: Diagnosis not present

## 2018-10-19 ENCOUNTER — Telehealth: Payer: Self-pay | Admitting: Internal Medicine

## 2018-10-19 MED ORDER — LOSARTAN POTASSIUM-HCTZ 100-25 MG PO TABS: 1.0000 | ORAL_TABLET | Freq: Every day | ORAL | 1 refills | Status: DC

## 2018-10-19 NOTE — Telephone Encounter (Signed)
Pt need refill for    Losartan hydrochlorotiazide 100 mg   3 refills   Sent to [facility]

## 2018-10-19 NOTE — Telephone Encounter (Signed)
Rx sent electronically.  

## 2019-01-29 DIAGNOSIS — Z9842 Cataract extraction status, left eye: Secondary | ICD-10-CM | POA: Diagnosis not present

## 2019-01-29 DIAGNOSIS — H52213 Irregular astigmatism, bilateral: Secondary | ICD-10-CM | POA: Diagnosis not present

## 2019-01-29 DIAGNOSIS — H53043 Amblyopia suspect, bilateral: Secondary | ICD-10-CM | POA: Diagnosis not present

## 2019-01-29 DIAGNOSIS — Z9841 Cataract extraction status, right eye: Secondary | ICD-10-CM | POA: Diagnosis not present

## 2019-01-29 DIAGNOSIS — H353132 Nonexudative age-related macular degeneration, bilateral, intermediate dry stage: Secondary | ICD-10-CM | POA: Diagnosis not present

## 2019-01-30 DIAGNOSIS — H35051 Retinal neovascularization, unspecified, right eye: Secondary | ICD-10-CM | POA: Diagnosis not present

## 2019-01-30 DIAGNOSIS — H35363 Drusen (degenerative) of macula, bilateral: Secondary | ICD-10-CM | POA: Diagnosis not present

## 2019-01-30 DIAGNOSIS — H35371 Puckering of macula, right eye: Secondary | ICD-10-CM | POA: Diagnosis not present

## 2019-01-30 DIAGNOSIS — H43822 Vitreomacular adhesion, left eye: Secondary | ICD-10-CM | POA: Diagnosis not present

## 2019-02-01 DIAGNOSIS — Z961 Presence of intraocular lens: Secondary | ICD-10-CM | POA: Diagnosis not present

## 2019-02-01 DIAGNOSIS — H26491 Other secondary cataract, right eye: Secondary | ICD-10-CM | POA: Diagnosis not present

## 2019-04-10 DIAGNOSIS — H35051 Retinal neovascularization, unspecified, right eye: Secondary | ICD-10-CM | POA: Diagnosis not present

## 2019-04-10 DIAGNOSIS — H35373 Puckering of macula, bilateral: Secondary | ICD-10-CM | POA: Diagnosis not present

## 2019-04-10 DIAGNOSIS — H353132 Nonexudative age-related macular degeneration, bilateral, intermediate dry stage: Secondary | ICD-10-CM | POA: Diagnosis not present

## 2019-04-10 DIAGNOSIS — H43822 Vitreomacular adhesion, left eye: Secondary | ICD-10-CM | POA: Diagnosis not present

## 2019-04-27 ENCOUNTER — Other Ambulatory Visit: Payer: Self-pay | Admitting: Internal Medicine

## 2019-05-21 ENCOUNTER — Encounter: Payer: Self-pay | Admitting: Internal Medicine

## 2019-05-21 ENCOUNTER — Other Ambulatory Visit: Payer: Self-pay

## 2019-05-21 ENCOUNTER — Ambulatory Visit (INDEPENDENT_AMBULATORY_CARE_PROVIDER_SITE_OTHER): Payer: Medicare PPO | Admitting: Internal Medicine

## 2019-05-21 VITALS — BP 108/70 | HR 64 | Temp 97.3°F | Ht 64.0 in | Wt 158.0 lb

## 2019-05-21 DIAGNOSIS — Z7189 Other specified counseling: Secondary | ICD-10-CM | POA: Diagnosis not present

## 2019-05-21 DIAGNOSIS — Z23 Encounter for immunization: Secondary | ICD-10-CM | POA: Diagnosis not present

## 2019-05-21 DIAGNOSIS — F4321 Adjustment disorder with depressed mood: Secondary | ICD-10-CM

## 2019-05-21 DIAGNOSIS — Z85820 Personal history of malignant melanoma of skin: Secondary | ICD-10-CM

## 2019-05-21 DIAGNOSIS — I10 Essential (primary) hypertension: Secondary | ICD-10-CM | POA: Diagnosis not present

## 2019-05-21 DIAGNOSIS — Z0000 Encounter for general adult medical examination without abnormal findings: Secondary | ICD-10-CM

## 2019-05-21 LAB — COMPREHENSIVE METABOLIC PANEL
ALT: 21 U/L (ref 0–35)
AST: 24 U/L (ref 0–37)
Albumin: 4.4 g/dL (ref 3.5–5.2)
Alkaline Phosphatase: 75 U/L (ref 39–117)
BUN: 18 mg/dL (ref 6–23)
CO2: 30 mEq/L (ref 19–32)
Calcium: 9.3 mg/dL (ref 8.4–10.5)
Chloride: 94 mEq/L — ABNORMAL LOW (ref 96–112)
Creatinine, Ser: 0.5 mg/dL (ref 0.40–1.20)
GFR: 118.62 mL/min (ref >60.00)
Glucose, Bld: 96 mg/dL (ref 70–99)
Potassium: 4.4 mEq/L (ref 3.5–5.1)
Sodium: 129 mEq/L — ABNORMAL LOW (ref 135–145)
Total Bilirubin: 0.9 mg/dL (ref 0.2–1.2)
Total Protein: 7.6 g/dL (ref 6.0–8.3)

## 2019-05-21 LAB — CBC
HCT: 38.3 % (ref 36.0–46.0)
Hemoglobin: 12.9 g/dL (ref 12.0–15.0)
MCHC: 33.7 g/dL (ref 30.0–36.0)
MCV: 86.7 fl (ref 78.0–100.0)
Platelets: 379 10*3/uL (ref 150.0–400.0)
RBC: 4.42 Mil/uL (ref 3.87–5.11)
RDW: 15.1 % (ref 11.5–15.5)
WBC: 5.4 10*3/uL (ref 4.0–10.5)

## 2019-05-21 NOTE — Assessment & Plan Note (Signed)
I have personally reviewed the Medicare Annual Wellness questionnaire and have noted 1. The patient's medical and social history 2. Their use of alcohol, tobacco or illicit drugs 3. Their current medications and supplements 4. The patient's functional ability including ADL's, fall risks, home safety risks and hearing or visual             impairment. 5. Diet and physical activities 6. Evidence for depression or mood disorders  The patients weight, height, BMI and visual acuity have been recorded in the chart I have made referrals, counseling and provided education to the patient based review of the above and I have provided the pt with a written personalized care plan for preventive services.  I have provided you with a copy of your personalized plan for preventive services. Please take the time to review along with your updated medication list.  No cancer screening due to age 78 to exercise regularly Flu vaccine today Consider shingrix at the pharmacy

## 2019-05-21 NOTE — Assessment & Plan Note (Signed)
Doing okay 2 months after husband's death

## 2019-05-21 NOTE — Progress Notes (Signed)
Hearing Screening   Method: Audiometry   125Hz  250Hz  500Hz  1000Hz  2000Hz  3000Hz  4000Hz  6000Hz  8000Hz   Right ear:   25 25 25  25     Left ear:   25 40 25  40    Vision Screening Comments: May 2020/September 2020

## 2019-05-21 NOTE — Progress Notes (Signed)
Subjective:    Patient ID: Barbara Murphy, female    DOB: Nov 26, 1940, 78 y.o.   MRN: [id number]  HPI Here for Medicare wellness visit and follow up of chronic health conditions Reviewed form and advanced directives Reviewed other doctors Does try to exercise regularly Did fall in hospital when seeing husband---toe caught on floor. Bruised right shoulder Vision okay---seeing retinal and laser specialists now Hearing is fine Exercising regularly Independent with instrumental ADLs No memory issues   Lost husband in August Very difficult but friends at [facility] really helping---mostly by phone calls Wasn't able to have normal funeral Feels she is moving through the grieving process okay  Blood pressure has been okay--hasn't been checking No chest pain or SOB No palpitations  No dizziness or syncope  Sees dermatologist regularly Lesion removed from arm earlier this year---plans to switch to someone local  Current Outpatient Medications on File Prior to Visit  Medication Sig Dispense Refill  . Alpha-Lipoic Acid 200 MG CAPS Take 1 capsule by mouth daily.    Marland Kitchen amLODipine (NORVASC) 5 MG tablet Take 1 tablet (5 mg total) by mouth daily. 90 tablet 3  . b complex vitamins tablet Take 1 tablet by mouth daily.    . Calcium Carbonate (CALCIUM 600 PO) Take 1 tablet by mouth daily.    . Coenzyme Q10 200 MG capsule Take 200 mg by mouth daily.    Marland Kitchen losartan-hydrochlorothiazide (HYZAAR) 100-25 MG tablet TAKE 1 TABLET EVERY DAY 90 tablet 3  . Magnesium 200 MG TABS Take 1 tablet by mouth daily.    . Multiple Vitamin (MULTIVITAMIN) tablet Take 1 tablet by mouth daily.    . Multiple Vitamins-Minerals (PRESERVISION AREDS 2 PO) Take by mouth.    . Omega-3 Fatty Acids (FISH OIL) 1000 MG CAPS Take 1 capsule by mouth daily.    . Probiotic Product (PROBIOTIC PO) Take 1 capsule by mouth daily.    . Turmeric 500 MG CAPS Take by mouth.    . Vitamin D, Cholecalciferol, 1000 units CAPS Take 2 capsules by  mouth.     No current facility-administered medications on file prior to visit.     Allergies  Allergen Reactions  . Sulfa Antibiotics Hives  . Metoprolol Other (See Comments)    Personality change, fatigue    Past Medical History:  Diagnosis Date  . Hypertension   . Melanoma ([place])   . Melanoma in situ ([place]) 07/2018   left arm    Past Surgical History:  Procedure Laterality Date  . [place]   benign  . CARDIOVASCULAR STRESS TEST  01/2015   Nuclear---negative  . DEXA  2005   normal  . MELANOMA EXCISION     multiple--- [facility]  . STRABISMUS SURGERY  1980  . TONSILLECTOMY      Family History  Problem Relation Age of Onset  . Cancer Mother        liver  . Parkinson's disease Father   . Diabetes Brother   . Heart disease Brother   . Stroke Sister   . Heart disease Sister     Social History   Socioeconomic History  . Marital status: Widowed    Spouse name: Not on file  . Number of children: 7  . Years of education: Not on file  . Highest education level: Not on file  Occupational History  . Occupation: Pharmacist, hospital-- [employer]    Comment: Retired  Scientific laboratory technician  . Financial resource strain:  Not on file  . Food insecurity    Worry: Not on file    Inability: Not on file  . Transportation needs    Medical: Not on file    Non-medical: Not on file  Tobacco Use  . Smoking status: Former Smoker    Packs/day: 0.25    Types: Cigarettes    Quit date: 07/25/1968    Years since quitting: 50.8  . Smokeless tobacco: Never Used  Substance and Sexual Activity  . Alcohol use: Not on file  . Drug use: Not on file  . Sexual activity: Not on file  Lifestyle  . Physical activity    Days per week: Not on file    Minutes per session: Not on file  . Stress: Not on file  Relationships  . Social Herbalist on phone: Not on file    Gets together: Not on file    Attends religious service: Not on file    Active member of club  or organization: Not on file    Attends meetings of clubs or organizations: Not on file    Relationship status: Not on file  . Intimate partner violence    Fear of current or ex partner: Not on file    Emotionally abused: Not on file    Physically abused: Not on file    Forced sexual activity: Not on file  Other Topics Concern  . Not on file  Social History Narrative   Second marriage-- 81. Widowed 2020   2 children, 5 stepchildren   Multiple grandchildren and great grandchildren      Has living will   Son Gerald Stabs has health care POA   Would accept resuscitation attempts   Would leave tube feeds up to son   Review of Systems Shoulder is better now--no other joint issues Appetite is good Weight is up 5# Not sleeping well lately---since husband died No heartburn or dysphagia Wears seat belt Keeps up with dentist Bowels are fine--no blood    Objective:   Physical Exam  Constitutional: She is oriented to person, place, and time. She appears well-developed. No distress.  HENT:  Mouth/Throat: Oropharynx is clear and moist. No oropharyngeal exudate.  No oral lesions  Neck: No thyromegaly present.  Cardiovascular: Normal rate, regular rhythm, normal heart sounds and intact distal pulses. Exam reveals no gallop.  No murmur heard. Respiratory: Effort normal and breath sounds normal. No respiratory distress. She has no wheezes. She has no rales.  GI: Soft. There is no abdominal tenderness.  Musculoskeletal:        General: No tenderness or edema.  Lymphadenopathy:    She has no cervical adenopathy.  Neurological: She is alert and oriented to person, place, and time.  President--- "Dwaine Deter, Bush" [phone] [id number] Recall 3/3  Skin: No rash noted. No erythema.  Psychiatric: She has a normal mood and affect. Her behavior is normal.           Assessment & Plan:

## 2019-05-21 NOTE — Assessment & Plan Note (Signed)
BP Readings from Last 3 Encounters:  05/21/19 108/70  05/15/18 124/80  04/28/17 138/82   Doing well Will check labs

## 2019-05-21 NOTE — Assessment & Plan Note (Signed)
See social history 

## 2019-05-21 NOTE — Addendum Note (Signed)
Addended by: Pilar Grammes on: 05/21/2019 02:41 PM   Modules accepted: Orders

## 2019-05-21 NOTE — Assessment & Plan Note (Signed)
Keeps up with dermatologist

## 2019-05-23 LAB — VITAMIN D 25 HYDROXY (VIT D DEFICIENCY, FRACTURES): VITD: 72.93 ng/mL (ref 30.00–100.00)

## 2019-06-12 ENCOUNTER — Other Ambulatory Visit: Payer: Self-pay | Admitting: Internal Medicine

## 2020-02-10 DIAGNOSIS — H53043 Amblyopia suspect, bilateral: Secondary | ICD-10-CM | POA: Diagnosis not present

## 2020-02-10 DIAGNOSIS — H353132 Nonexudative age-related macular degeneration, bilateral, intermediate dry stage: Secondary | ICD-10-CM | POA: Diagnosis not present

## 2020-02-10 DIAGNOSIS — Z9841 Cataract extraction status, right eye: Secondary | ICD-10-CM | POA: Diagnosis not present

## 2020-02-10 DIAGNOSIS — Z9842 Cataract extraction status, left eye: Secondary | ICD-10-CM | POA: Diagnosis not present

## 2020-02-10 DIAGNOSIS — H52213 Irregular astigmatism, bilateral: Secondary | ICD-10-CM | POA: Diagnosis not present

## 2020-03-03 ENCOUNTER — Encounter: Payer: Self-pay | Admitting: Nurse Practitioner

## 2020-03-03 ENCOUNTER — Other Ambulatory Visit: Payer: Self-pay

## 2020-03-03 ENCOUNTER — Ambulatory Visit: Payer: Medicare PPO | Admitting: Nurse Practitioner

## 2020-03-03 VITALS — BP 160/90 | HR 90 | Temp 96.7°F | Ht 64.0 in | Wt 163.0 lb

## 2020-03-03 DIAGNOSIS — R300 Dysuria: Secondary | ICD-10-CM | POA: Diagnosis not present

## 2020-03-03 DIAGNOSIS — I10 Essential (primary) hypertension: Secondary | ICD-10-CM | POA: Diagnosis not present

## 2020-03-03 NOTE — Progress Notes (Signed)
Careteam: Patient Care Team: Venia Carbon, MD as PCP - General (Internal Medicine)  Advanced Directive information    Allergies  Allergen Reactions  . Sulfa Antibiotics Hives  . Other   . Metoprolol Other (See Comments)    Personality change, fatigue    Chief Complaint  Patient presents with  . Acute Visit    Possible UTI. Patient having symptoms of burning on urination,cloudy urine,no fever,urinary urgency. Since Friday night.     HPI: Patient is a 79 y.o. female seen in today at the [facility] for burning with urination. Better today. Drinking a lot of extra water and taking cranberry.  No fevers or chills. Overall felt fine. No back pain, abdominal pressure.  Every day getting better. Does not really want to be on antibiotics.  Did not want to come in but did not know what will happen if she did not.   Blood pressure generally elevated when going to medical facility but she did not take medication today.   Review of Systems:  Review of Systems  Constitutional: Negative for chills, fever and malaise/fatigue.  Respiratory: Negative for shortness of breath.   Cardiovascular: Negative for chest pain and palpitations.  Genitourinary: Positive for dysuria. Negative for flank pain, frequency, hematuria and urgency.    Past Medical History:  Diagnosis Date  . Hypertension   . Melanoma ([place])   . Melanoma in situ ([place]) 07/2018   left arm   Past Surgical History:  Procedure Laterality Date  . [place]   benign  . CARDIOVASCULAR STRESS TEST  01/2015   Nuclear---negative  . DEXA  2005   normal  . MELANOMA EXCISION     multiple--- [facility]  . STRABISMUS SURGERY  1980  . TONSILLECTOMY     Social History:   reports that she quit smoking about 51 years ago. Her smoking use included cigarettes. She smoked 0.25 packs per day. She has never used smokeless tobacco. No history on file for alcohol use and drug  use.  Family History  Problem Relation Age of Onset  . Cancer Mother        liver  . Parkinson's disease Father   . Diabetes Brother   . Heart disease Brother   . Stroke Sister   . Heart disease Sister     Medications: Patient's Medications  New Prescriptions   No medications on file  Previous Medications   ALPHA-LIPOIC ACID 200 MG CAPS    Take 1 capsule by mouth daily.   AMLODIPINE (NORVASC) 5 MG TABLET    TAKE 1 TABLET EVERY DAY   B COMPLEX VITAMINS TABLET    Take 1 tablet by mouth daily.   CALCIUM CARBONATE (CALCIUM 600 PO)    Take 1 tablet by mouth daily.   COENZYME Q10 200 MG CAPSULE    Take 200 mg by mouth daily.   LOSARTAN-HYDROCHLOROTHIAZIDE (HYZAAR) 100-25 MG TABLET    TAKE 1 TABLET EVERY DAY   MAGNESIUM 200 MG TABS    Take 1 tablet by mouth daily.   MULTIPLE VITAMIN (MULTIVITAMIN) TABLET    Take 1 tablet by mouth daily.   MULTIPLE VITAMINS-MINERALS (PRESERVISION AREDS 2 PO)    Take by mouth.   OMEGA-3 FATTY ACIDS (FISH OIL) 1000 MG CAPS    Take 1 capsule by mouth daily.   PROBIOTIC PRODUCT (PROBIOTIC PO)    Take 1 capsule by mouth daily.   TURMERIC 500 MG CAPS  Take by mouth.   VITAMIN D, CHOLECALCIFEROL, 1000 UNITS CAPS    Take 2 capsules by mouth.  Modified Medications   No medications on file  Discontinued Medications   No medications on file    Physical Exam:  Vitals:   03/03/20 1255  BP: (!) 160/90  Pulse: 90  Temp: (!) 96.7 F (35.9 C)  SpO2: 98%  Weight: 163 lb (73.9 kg)  Height: 5\' 4"  (1.626 m)   Body mass index is 27.98 kg/m. Wt Readings from Last 3 Encounters:  03/03/20 163 lb (73.9 kg)  05/21/19 158 lb (71.7 kg)  05/15/18 152 lb (68.9 kg)    Physical Exam Constitutional:      General: She is not in acute distress.    Appearance: She is well-developed. She is not diaphoretic.  HENT:     Head: Normocephalic and atraumatic.  Cardiovascular:     Rate and Rhythm: Normal rate and regular rhythm.     Heart sounds: Murmur heard.    Pulmonary:     Effort: Pulmonary effort is normal.     Breath sounds: Normal breath sounds.  Abdominal:     General: Bowel sounds are normal. There is no distension.     Palpations: Abdomen is soft.     Tenderness: There is no abdominal tenderness.  Musculoskeletal:        General: No tenderness.     Cervical back: Normal range of motion and neck supple.  Skin:    General: Skin is warm and dry.  Neurological:     Mental Status: She is alert and oriented to person, place, and time.  Psychiatric:        Mood and Affect: Mood normal.        Behavior: Behavior normal.    Labs reviewed: Basic Metabolic Panel: Recent Labs    05/21/19 1005  NA 129*  K 4.4  CL 94*  CO2 30  GLUCOSE 96  BUN 18  CREATININE 0.50  CALCIUM 9.3   Liver Function Tests: Recent Labs    05/21/19 1005  AST 24  ALT 21  ALKPHOS 75  BILITOT 0.9  PROT 7.6  ALBUMIN 4.4   No results for input(s): LIPASE, AMYLASE in the last 8760 hours. No results for input(s): AMMONIA in the last 8760 hours. CBC: Recent Labs    05/21/19 1005  WBC 5.4  HGB 12.9  HCT 38.3  MCV 86.7  PLT 379.0   Lipid Panel: No results for input(s): CHOL, HDL, LDLCALC, TRIG, CHOLHDL, LDLDIRECT in the last 8760 hours. TSH: No results for input(s): TSH in the last 8760 hours. A1C: No results found for: HGBA1C   Assessment/Plan 1. Dysuria Overall symptoms improving, without frequency, urgency, abdominal pain, fever or chills. Continues with increasing hydration with water and cranberry tablets. She was unable to leave specimen at this time but reports if symptoms persist will bring in clean catch for UA C&S  2. Essential hypertension -elevated today however did not take her medications, generally well controlled.   Carlos American. [place], [facility] [phone]

## 2020-03-24 ENCOUNTER — Encounter: Payer: Self-pay | Admitting: Nurse Practitioner

## 2020-03-24 DIAGNOSIS — R300 Dysuria: Secondary | ICD-10-CM | POA: Diagnosis not present

## 2020-03-26 ENCOUNTER — Telehealth: Payer: Self-pay

## 2020-03-26 NOTE — Telephone Encounter (Signed)
Dinah reviewed urine culture results and stated that patient needed to recollect if patient still having symptoms. I called and spoke with patient she stated that she is still having symptoms of burning and cloudy urine. I told patient that she could be put on our schedule for today to see Monina, NP so she could give another urine sample or that she may want to make and appointment with PCP. She did not want to make an appointment, she just wanted to come in get a specimen cup and return it later. I told her we could not do that. She then stated that she would try to get in to see Dr. Silvio Pate (PCP).

## 2020-03-26 NOTE — Telephone Encounter (Deleted)
Spoke with Merlene Morse about patient and U/A was not performed because urine for U/A was not sent. Urine culture was performed. Will have Dinah, NP or Dr. Mariea Clonts look at results and advise. Will also call patient to see if she is still having any symptoms

## 2020-03-26 NOTE — Telephone Encounter (Signed)
I called and spoke with [place] regarding the lab work. The U/A on the oreder was not performed because urine for U/A was not sent. Urine was sent for the culture and the culture was performed. Will have Dinah to review lab results and advise. I will also call the patient to see if she is still having any symptoms.

## 2020-03-26 NOTE — Telephone Encounter (Signed)
Clay Nurse at [facility] called to inform me that she received a message from [facility] indicating Urine Specimen was not acceptable, no suitable urine specimen was received.  I reviewed chart and was unable to see any documentation that indicated a [facility] provider had collected a recent urine specimen on this patient. Janci then asked to speak with Evie (medical assistant) that goes to [facility] with [employer]. Dewaine Oats, NP.  I placed Janci on hold and spoke with Thea Silversmith recalls an interaction with this patient on Tuesday 03/24/2020 regarding a urine specimen.   The call rung back to my phone and converted to the Clinical Intake voicemail. Evie will call Janci back to further discuss.  Janci's contact number is [phone] (working from home)

## 2020-04-01 ENCOUNTER — Ambulatory Visit: Payer: Medicare PPO | Admitting: Dermatology

## 2020-04-01 ENCOUNTER — Other Ambulatory Visit: Payer: Self-pay

## 2020-04-01 DIAGNOSIS — D1800 Hemangioma unspecified site: Secondary | ICD-10-CM

## 2020-04-01 DIAGNOSIS — L821 Other seborrheic keratosis: Secondary | ICD-10-CM | POA: Diagnosis not present

## 2020-04-01 DIAGNOSIS — L814 Other melanin hyperpigmentation: Secondary | ICD-10-CM

## 2020-04-01 DIAGNOSIS — Z85820 Personal history of malignant melanoma of skin: Secondary | ICD-10-CM | POA: Diagnosis not present

## 2020-04-01 DIAGNOSIS — Z86007 Personal history of in-situ neoplasm of skin: Secondary | ICD-10-CM | POA: Diagnosis not present

## 2020-04-01 DIAGNOSIS — I781 Nevus, non-neoplastic: Secondary | ICD-10-CM | POA: Diagnosis not present

## 2020-04-01 DIAGNOSIS — D229 Melanocytic nevi, unspecified: Secondary | ICD-10-CM

## 2020-04-01 DIAGNOSIS — Z1283 Encounter for screening for malignant neoplasm of skin: Secondary | ICD-10-CM | POA: Diagnosis not present

## 2020-04-01 DIAGNOSIS — L578 Other skin changes due to chronic exposure to nonionizing radiation: Secondary | ICD-10-CM

## 2020-04-01 DIAGNOSIS — L570 Actinic keratosis: Secondary | ICD-10-CM

## 2020-04-01 DIAGNOSIS — Z86018 Personal history of other benign neoplasm: Secondary | ICD-10-CM

## 2020-04-01 DIAGNOSIS — Z87898 Personal history of other specified conditions: Secondary | ICD-10-CM

## 2020-04-01 DIAGNOSIS — L905 Scar conditions and fibrosis of skin: Secondary | ICD-10-CM | POA: Diagnosis not present

## 2020-04-01 DIAGNOSIS — Z85828 Personal history of other malignant neoplasm of skin: Secondary | ICD-10-CM

## 2020-04-01 NOTE — Patient Instructions (Addendum)

## 2020-04-01 NOTE — Progress Notes (Signed)
New Patient Visit  Subjective  Barbara Murphy is a 79 y.o. female who presents for the following: New Patient (Initial Visit) (Hx of several MM).  Patient presents today for full body skin exam and skin cancer screening. She does have a h/o MMis and MM. All removed with surgery. She has a scaly area on her left cheek that has been there for some time, not painful, has not been treated.   The following portions of the chart were reviewed this encounter and updated as appropriate:  Tobacco  Allergies  Meds  Problems  Med Hx  Surg Hx  Fam Hx        Review of Systems:  No other skin or systemic complaints except as noted in HPI or Assessment and Plan.  Objective  Well appearing patient in no apparent distress; mood and affect are within normal limits.  A full examination was performed including scalp, head, eyes, ears, nose, lips, neck, chest, axillae, abdomen, back, buttocks, bilateral upper extremities, bilateral lower extremities, hands, feet, fingers, toes, fingernails, and toenails. All findings within normal limits unless otherwise noted below.  Objective  Left Left Cheek, Left Upper Arm - Posterior (2): Well healed scar with no evidence of recurrence, no lymphadenopathy.   Objective  Right Forearm - Anterior: Well healed scar with no evidence of recurrence.   Objective  Left Medial cheek, Left dorsal hand (2), Right Forearm - Anterior, Right Upper Arm - Anterior (3): Erythematous thin papules/macules with gritty scale.  Left medial cheek hypertrophic   Objective  Left Upper Back: Scar with no evidence of recurrence.    Assessment & Plan  History of melanoma in situ (3) Left Upper Arm - Posterior (2); Left Left Cheek  Left cheek Lentigo Maligna 2009 Left post upper arm 2012 Left lateral proximal upper arm 2019  Clear. Observe for recurrence. Call clinic for new or changing lesions.  Recommend regular skin exams, daily broad-spectrum spf 30+ sunscreen use, and  photoprotection.     History of basal cell carcinoma (BCC) Right Forearm - Anterior  Clear. Observe for recurrence. Call clinic for new or changing lesions.  Recommend regular skin exams, daily broad-spectrum spf 30+ sunscreen use, and photoprotection.     AK (actinic keratosis) (7) Left dorsal hand (2); Right Upper Arm - Anterior (3); Right Forearm - Anterior; Left Medial cheek  Cryotherapy today Prior to procedure, discussed risks of blister formation, small wound, skin dyspigmentation, or rare scar following cryotherapy.     Destruction of lesion - Left dorsal hand, Right Forearm - Anterior, Right Upper Arm - Anterior  Destruction method: cryotherapy   Informed consent: discussed and consent obtained   Lesion destroyed using liquid nitrogen: Yes   Outcome: patient tolerated procedure well with no complications   Post-procedure details: wound care instructions given    History of atypical nevus Left Upper Back  Left lateral upper back Moderate to sever 2015 Clear. Observe for recurrence. Call clinic for new or changing lesions.  Recommend regular skin exams, daily broad-spectrum spf 30+ sunscreen use, and photoprotection.     Telangiectasia Right Shoulder -Superior  Benign-appearing.  Observation.  Call clinic for new or changing areas.  Recommend daily use of broad spectrum spf 30+ sunscreen to sun-exposed areas.   Will recheck on follow up  Scar Right Thigh - Posterior  Benign, observe.     Lentigines - Scattered tan macules - Discussed due to sun exposure - Benign, observe - Call for any changes  Seborrheic Keratoses -  Stuck-on, waxy, tan-brown papules and plaques  - Discussed benign etiology and prognosis. - Observe - Call for any changes  Melanocytic Nevi - Tan-brown and/or pink-flesh-colored symmetric macules and papules - Benign appearing on exam today - Observation - Call clinic for new or changing moles - Recommend daily use of broad spectrum  spf 30+ sunscreen to sun-exposed areas.   Hemangiomas - Red papules - Discussed benign nature - Observe - Call for any changes  Actinic Damage - diffuse scaly erythematous macules with underlying dyspigmentation - Recommend daily broad spectrum sunscreen SPF 30+ to sun-exposed areas, reapply every 2 hours as needed.  - Call for new or changing lesions.  Skin cancer screening performed today.  History of Lentigo Maligna Left cheek 2009 - No evidence of recurrence today - No lymphadenopathy - Recommend regular full body skin exams - Recommend daily broad spectrum sunscreen SPF 30+ to sun-exposed areas, reapply every 2 hours as needed.  - Call if any new or changing lesions are noted between office visits  History of Melanoma in Situ L. Post upper arm 2012, and Left lat prox upper arm 2019 - No evidence of recurrence today - No lymphadenopathy - Recommend regular full body skin exams - Recommend daily broad spectrum sunscreen SPF 30+ to sun-exposed areas, reapply every 2 hours as needed.  - Call if any new or changing lesions are noted between office visits  History of Basal Cell Carcinoma of the Skin R. Forearm 2009 - No evidence of recurrence today - Recommend regular full body skin exams - Recommend daily broad spectrum sunscreen SPF 30+ to sun-exposed areas, reapply every 2 hours as needed.  - Call if any new or changing lesions are noted between office visits  History of Dysplastic Nevi moderate to severe L. Lat upper back 2019 - No evidence of recurrence today - Recommend regular full body skin exams - Recommend daily broad spectrum sunscreen SPF 30+ to sun-exposed areas, reapply every 2 hours as needed.  - Call if any new or changing lesions are noted between office visits    Return in about 6 months (around 09/29/2020) for TBSE.  I, Donzetta Kohut, CMA, am acting as scribe for Forest Gleason, MD .  Documentation: I have reviewed the above documentation for accuracy and  completeness, and I agree with the above.  Forest Gleason, MD

## 2020-04-07 ENCOUNTER — Other Ambulatory Visit: Payer: Self-pay | Admitting: Internal Medicine

## 2020-04-07 ENCOUNTER — Other Ambulatory Visit: Payer: Self-pay

## 2020-04-07 ENCOUNTER — Ambulatory Visit: Payer: Medicare PPO | Admitting: Nurse Practitioner

## 2020-04-07 ENCOUNTER — Encounter: Payer: Self-pay | Admitting: Nurse Practitioner

## 2020-04-07 VITALS — BP 120/80 | HR 83 | Temp 97.3°F | Ht 64.0 in | Wt 166.5 lb

## 2020-04-07 DIAGNOSIS — M545 Low back pain, unspecified: Secondary | ICD-10-CM

## 2020-04-07 DIAGNOSIS — R300 Dysuria: Secondary | ICD-10-CM

## 2020-04-07 NOTE — Progress Notes (Signed)
Careteam: Patient Care Team: Venia Carbon, MD as PCP - General (Internal Medicine)  Advanced Directive information    Allergies  Allergen Reactions   Sulfa Antibiotics Hives   Other    Metoprolol Other (See Comments)    Personality change, fatigue    Chief Complaint  Patient presents with   Acute Visit    Possible UTI. Patient is still having a little burining on urination, cloudy urine, a little bit of back ache. No urinary urgency. No fever. Problem had been going on for about the last month. Symptoms come and go.     HPI: Patient is a 79 y.o. female seen in today at the [facility] for ongoing urinary concerns. Pt of Dr Lubertha Sayres, she had seen me over a month ago with urinary concerns but felt like they were improving and therefore we did not obtain urine. Dysuria returned and urine culture obtained on 03/24/20 which was negative for UTI.  Reports some days she will have frequent burning with urination and some days she has none. Today she does not have burning   Has some incontinence, not recently.  Some increase in frequency- consistent over the last 2 weeks.  No increase in urgency. No fevers or chills.    low back ache. 3-4/10, took 1 ibuprofen and it went away. No numbness or tingling in the legs.     Review of Systems:  Review of Systems  Constitutional: Negative for chills and fever.  Genitourinary: Positive for dysuria and frequency. Negative for flank pain, hematuria and urgency.  Musculoskeletal: Positive for back pain (mild low back pain).  Skin: Negative for itching and rash.    Past Medical History:  Diagnosis Date   Hypertension    Melanoma ([place])    Melanoma in situ ([place]) 07/2018   left arm   Past Surgical History:  Procedure Laterality Date   BREAST BIOPSY  1962 & 1980   benign   CARDIOVASCULAR STRESS TEST  01/2015   Nuclear---negative   DEXA  2005   normal   MELANOMA EXCISION     multiple--- [facility]   STRABISMUS SURGERY  1980   TONSILLECTOMY     Social History:   reports that she quit smoking about 51 years ago. Her smoking use included cigarettes. She smoked 0.25 packs per day. She has never used smokeless tobacco. No history on file for alcohol use and drug use.  Family History  Problem Relation Age of Onset   Cancer Mother        liver   Parkinson's disease Father    Diabetes Brother    Heart disease Brother    Stroke Sister    Heart disease Sister     Medications: Patient's Medications  New Prescriptions   No medications on file  Previous Medications   ALPHA-LIPOIC ACID 200 MG CAPS    Take 1 capsule by mouth daily.   AMLODIPINE (NORVASC) 5 MG TABLET    TAKE 1 TABLET EVERY DAY   B COMPLEX VITAMINS TABLET    Take 1 tablet by mouth daily.   CALCIUM CARBONATE (CALCIUM 600 PO)    Take 1 tablet by mouth daily.   COENZYME Q10 200 MG CAPSULE    Take 200 mg by mouth daily.   LOSARTAN-HYDROCHLOROTHIAZIDE (HYZAAR) 100-25 MG TABLET    TAKE 1 TABLET EVERY DAY   MAGNESIUM 200 MG TABS    Take 1 tablet by mouth daily.   MULTIPLE VITAMIN (MULTIVITAMIN) TABLET  Take 1 tablet by mouth daily.   MULTIPLE VITAMINS-MINERALS (PRESERVISION AREDS 2 PO)    Take by mouth.   OMEGA-3 FATTY ACIDS (FISH OIL) 1000 MG CAPS    Take 1 capsule by mouth daily.   PROBIOTIC PRODUCT (PROBIOTIC PO)    Take 1 capsule by mouth daily.   TURMERIC 500 MG CAPS    Take by mouth.   VITAMIN D, CHOLECALCIFEROL, 1000 UNITS CAPS    Take 2 capsules by mouth.  Modified Medications   No medications on file  Discontinued Medications   No medications on file    Physical Exam:  Vitals:   04/07/20 1337  BP: 120/80  Pulse: 83  Temp: (!) 97.3 F (36.3 C)  TempSrc: Temporal  SpO2: 98%  Weight: 166 lb 8 oz (75.5 kg)  Height: 5\' 4"  (1.626 m)   Body mass index is 28.58 kg/m. Wt Readings from Last 3 Encounters:  04/07/20 166 lb 8 oz (75.5 kg)  03/03/20 163 lb (73.9 kg)  05/21/19 158 lb (71.7  kg)    Physical Exam Constitutional:      General: She is not in acute distress.    Appearance: She is well-developed. She is not diaphoretic.  HENT:     Head: Normocephalic and atraumatic.  Eyes:     Conjunctiva/sclera: Conjunctivae normal.     Pupils: Pupils are equal, round, and reactive to light.  Cardiovascular:     Rate and Rhythm: Normal rate and regular rhythm.     Heart sounds: Normal heart sounds.  Pulmonary:     Effort: Pulmonary effort is normal.     Breath sounds: Normal breath sounds.  Abdominal:     General: Bowel sounds are normal. There is no distension.     Palpations: Abdomen is soft.     Tenderness: There is no abdominal tenderness. There is no right CVA tenderness, left CVA tenderness, guarding or rebound.     Hernia: No hernia is present.  Musculoskeletal:        General: No tenderness.     Cervical back: Normal range of motion and neck supple.     Right lower leg: No edema.     Left lower leg: No edema.  Skin:    General: Skin is warm and dry.  Neurological:     Mental Status: She is alert and oriented to person, place, and time.     Labs reviewed: Basic Metabolic Panel: Recent Labs    05/21/19 1005  NA 129*  K 4.4  CL 94*  CO2 30  GLUCOSE 96  BUN 18  CREATININE 0.50  CALCIUM 9.3   Liver Function Tests: Recent Labs    05/21/19 1005  AST 24  ALT 21  ALKPHOS 75  BILITOT 0.9  PROT 7.6  ALBUMIN 4.4   No results for input(s): LIPASE, AMYLASE in the last 8760 hours. No results for input(s): AMMONIA in the last 8760 hours. CBC: Recent Labs    05/21/19 1005  WBC 5.4  HGB 12.9  HCT 38.3  MCV 86.7  PLT 379.0   Lipid Panel: No results for input(s): CHOL, HDL, LDLCALC, TRIG, CHOLHDL, LDLDIRECT in the last 8760 hours. TSH: No results for input(s): TSH in the last 8760 hours. A1C: No results found for: HGBA1C   Assessment/Plan 1. Dysuria Previous culture negative. Pt would like to have another urine sent due to ongoing but not  persistent dysuria. Discussed possibility of vaginal atrophy causing symptoms but she did not feel like this was  the cause.  -no CVA or abdominal tenderness.  -UA C&S for evaluation at this time again encouraged proper hydration. To follow up with PCP if continues   2. Acute midline low back pain without sciatica -new onset and mild, no pain during assessment. No warning signs. took ibuprofen today which relieved the pain. Supportive care at this time and encouraged to follow up with PCP if continued or worsening of pain.   Carlos American. Diaz, [facility] [phone]

## 2020-04-09 ENCOUNTER — Ambulatory Visit: Payer: Medicare PPO | Admitting: Nurse Practitioner

## 2020-04-14 ENCOUNTER — Encounter: Payer: Self-pay | Admitting: Dermatology

## 2020-04-20 ENCOUNTER — Other Ambulatory Visit: Payer: Self-pay

## 2020-04-20 ENCOUNTER — Ambulatory Visit: Payer: Medicare PPO | Admitting: Obstetrics and Gynecology

## 2020-04-20 ENCOUNTER — Encounter: Payer: Self-pay | Admitting: Obstetrics and Gynecology

## 2020-04-20 VITALS — BP 150/70 | Ht 64.0 in | Wt 163.0 lb

## 2020-04-20 DIAGNOSIS — N362 Urethral caruncle: Secondary | ICD-10-CM | POA: Diagnosis not present

## 2020-04-20 DIAGNOSIS — N3001 Acute cystitis with hematuria: Secondary | ICD-10-CM | POA: Diagnosis not present

## 2020-04-20 DIAGNOSIS — R300 Dysuria: Secondary | ICD-10-CM | POA: Diagnosis not present

## 2020-04-20 LAB — POCT URINALYSIS DIPSTICK
Bilirubin, UA: NEGATIVE
Glucose, UA: NEGATIVE
Ketones, UA: NEGATIVE
Nitrite, UA: POSITIVE
Protein, UA: NEGATIVE
Spec Grav, UA: 1.01
pH, UA: 6

## 2020-04-20 LAB — POCT WET PREP WITH KOH
Clue Cells Wet Prep HPF POC: NEGATIVE
KOH Prep POC: NEGATIVE
Trichomonas, UA: NEGATIVE
Yeast Wet Prep HPF POC: NEGATIVE

## 2020-04-20 MED ORDER — NITROFURANTOIN MONOHYD MACRO 100 MG PO CAPS
100.0000 mg | ORAL_CAPSULE | Freq: Two times a day (BID) | ORAL | 0 refills | Status: AC
Start: 2020-04-20 — End: 2020-04-25

## 2020-04-20 MED ORDER — PREMARIN 0.625 MG/GM VA CREA: TOPICAL_CREAM | VAGINAL | 0 refills | Status: DC

## 2020-04-20 NOTE — Patient Instructions (Signed)
I value your feedback and entrusting us with your care. If you get a [facility] patient survey, I would appreciate you taking the time to let us know about your experience today. Thank you!  As of July 04, 2019, your lab results will be released to your MyChart immediately, before I even have a chance to see them. Please give me time to review them and contact you if there are any abnormalities. Thank you for your patience.  

## 2020-04-20 NOTE — Progress Notes (Signed)
Barbara Carbon, MD   Chief Complaint  Patient presents with   Urinary Tract Infection    frequency and burning urinating x couple of months    HPI:      Barbara Murphy is a 79 y.o. No obstetric history on file. whose LMP was No LMP recorded. Patient is postmenopausal., presents today for NP eval of dysuria with neg UTI eval/neg C&S 8/21 with PCP, thought to maybe be vaginal etiology since sx persisting. Referred by Sunday Spillers. Pt has dysuria with urinary frequency with good flow, intermittent sx for a couple months. Has had 2 episodes of vaginal burning without urination. Uses scented castille soap, unscented dryer sheets, incont pads, occas vaginal wipes. No hematuria, LBP, pelvic pain, fevers. No other vag sx of d/c, odor. Had 1 UTI yrs ago. She is not sex active. No vag sx otherwise.    Past Medical History:  Diagnosis Date   Hypertension    Melanoma ([place])    Melanoma in situ ([place]) 07/2018   left arm    Past Surgical History:  Procedure Laterality Date   BREAST BIOPSY  1962 & 1980   benign   CARDIOVASCULAR STRESS TEST  01/2015   Nuclear---negative   DEXA  2005   normal   MELANOMA EXCISION     multiple--- [facility]   STRABISMUS SURGERY  1980   TONSILLECTOMY      Family History  Problem Relation Age of Onset   Cancer Mother        liver   Parkinson's disease Father    Diabetes Brother    Heart disease Brother    Stroke Sister    Heart disease Sister     Social History   Socioeconomic History   Marital status: Widowed    Spouse name: Not on file   Number of children: 7   Years of education: Not on file   Highest education level: Not on file  Occupational History   Occupation: Pharmacist, 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 Widowed 2020   2 children, 5 stepchildren   Multiple grandchildren and great grandchildren      Has living will   Son Barbara Murphy has health care POA   Would accept resuscitation attempts   Would leave tube feeds up to son   Social Determinants of Health   Financial Resource Strain:    Difficulty of Paying Living Expenses: Not on file  Food Insecurity:    Worried About Charity fundraiser in the Last Year: Not on file   [employer] of Food in the Last Year: Not on file  Transportation Needs:    Lack of Transportation (Medical): Not on file   Lack of Transportation (Non-Medical): Not on file  Physical Activity:    Days of Exercise per Week: Not on file   Minutes of Exercise per Session: Not on file  Stress:    Feeling of Stress : Not on file  Social Connections:    Frequency of  Communication with Friends and Family: Not on file   Frequency of Social Gatherings with Friends and Family: Not on file   Attends Religious Services: Not on file   Active Member of Clubs or Organizations: Not on file   Attends Archivist Meetings: Not on file   Marital Status: Not on file  Intimate Partner Violence:    Fear of Current or Ex-Partner: Not on file   Emotionally Abused: Not on file   Physically Abused: Not on file   Sexually Abused: Not on file    Outpatient Medications Prior to Visit  Medication Sig Dispense Refill   Alpha-Lipoic Acid 200 MG CAPS Take 1 capsule by mouth daily.     amLODipine (NORVASC) 5 MG tablet TAKE 1 TABLET EVERY DAY 90 tablet 3   b complex vitamins tablet Take 1 tablet by mouth daily.     Calcium Carbonate (CALCIUM 600 PO) Take 1 tablet by mouth daily.     Coenzyme Q10 200 MG capsule Take 200 mg by mouth daily.     losartan-hydrochlorothiazide  (HYZAAR) 100-25 MG tablet TAKE 1 TABLET EVERY DAY 90 tablet 0   Magnesium 200 MG TABS Take 1 tablet by mouth daily.     Multiple Vitamin (MULTIVITAMIN) tablet Take 1 tablet by mouth daily.     Multiple Vitamins-Minerals (PRESERVISION AREDS 2 PO) Take by mouth.     Omega-3 Fatty Acids (FISH OIL) 1000 MG CAPS Take 1 capsule by mouth daily.     Probiotic Product (PROBIOTIC PO) Take 1 capsule by mouth daily.     Turmeric 500 MG CAPS Take by mouth.     Vitamin D, Cholecalciferol, 1000 units CAPS Take 2 capsules by mouth.     No facility-administered medications prior to visit.      ROS:  Review of Systems  Constitutional: Negative for fever.  Gastrointestinal: Negative for blood in stool, constipation, diarrhea, nausea and vomiting.  Genitourinary: Positive for dysuria and frequency. Negative for dyspareunia, flank pain, hematuria, urgency, vaginal bleeding, vaginal discharge and vaginal pain.  Musculoskeletal: Negative for back pain.  Skin: Negative for rash.  BREAST: No symptoms   OBJECTIVE:   Vitals:  BP (!) 150/70    Ht 5\' 4"  (1.626 m)    Wt 163 lb (73.9 kg)    BMI 27.98 kg/m   Physical Exam Vitals reviewed.  Constitutional:      Appearance: She is well-developed.  Pulmonary:     Effort: Pulmonary effort is normal.  Genitourinary:    General: Normal vulva.     Pubic Area: No rash.      Labia:        Right: No rash, tenderness or lesion.        Left: No rash, tenderness or lesion.      Vagina: Normal. No vaginal discharge, erythema or tenderness.     Cervix: Normal.     Uterus: Normal. Not enlarged and not tender.      Adnexa: Right adnexa normal and left adnexa normal.       Right: No mass or tenderness.         Left: No mass or tenderness.       Comments: URETHRAL CARUNCLE; VAGINAL ATROPHY Musculoskeletal:        General: Normal range of motion.     Cervical back: Normal range of motion.  Skin:    General: Skin is warm and dry.  Neurological:      General: No focal deficit present.  Mental Status: She is alert and oriented to person, place, and time.  Psychiatric:        Mood and Affect: Mood normal.        Behavior: Behavior normal.        Thought Content: Thought content normal.        Judgment: Judgment normal.     Results: Results for orders placed or performed in visit on 04/20/20 (from the past 24 hour(s))  POCT Urinalysis Dipstick     Status: Abnormal   Collection Time: 04/20/20  4:05 PM  Result Value Ref Range   Color, UA YELLOW    Clarity, UA CLOUDY    Glucose, UA Negative Negative   Bilirubin, UA neg    Ketones, UA neg    Spec Grav, UA 1.010 1.010 - 1.025   Blood, UA small    pH, UA 6.0 5.0 - 8.0   Protein, UA Negative Negative   Urobilinogen, UA     Nitrite, UA pos    Leukocytes, UA Moderate (2+) (A) Negative   Appearance     Odor    POCT Wet Prep with KOH     Status: Normal   Collection Time: 04/20/20  4:06 PM  Result Value Ref Range   Trichomonas, UA Negative    Clue Cells Wet Prep HPF POC neg    Epithelial Wet Prep HPF POC     Yeast Wet Prep HPF POC neg    Bacteria Wet Prep HPF POC     RBC Wet Prep HPF POC     WBC Wet Prep HPF POC     KOH Prep POC Negative Negative     Assessment/Plan: Acute cystitis with hematuria - Plan: nitrofurantoin, macrocrystal-monohydrate, (MACROBID) 100 MG capsule, POCT Urinalysis Dipstick, Urine Culture; Pos UA today, check C&S. Rx macrobid. Will f/u with results.   Dysuria - Plan: POCT Wet Prep with KOH; discussed chem derm etiology of dysuria. Water, no dryer sheets, no scented wipes, limit pads. F/u prn.  Urethral caruncle - Plan: conjugated estrogens (PREMARIN) vaginal cream; could be cause of dysuria. Sitz baths/vag ERT for 2 wks. Can f/u with urology if sx persist. F/u prn.    Meds ordered this encounter  Medications   nitrofurantoin, macrocrystal-monohydrate, (MACROBID) 100 MG capsule    Sig: Take 1 capsule (100 mg total) by mouth 2 (two) times daily for  5 days.    Dispense:  10 capsule    Refill:  0    Order Specific Question:   Supervising Provider    Answer:   Gae Dry [[id number]]   conjugated estrogens (PREMARIN) vaginal cream    Sig: AAA small amount QHS for 2 wks; 1 SAMPLE GIVEN    Dispense:  30 g    Refill:  0    Order Specific Question:   Supervising Provider    Answer:   Gae Dry [[id number]]      Return if symptoms worsen or fail to improve.  Jajaira Ruis B. Haileigh Pitz, PA-C 04/20/2020 4:10 PM

## 2020-04-21 ENCOUNTER — Telehealth: Payer: Self-pay

## 2020-04-21 NOTE — Telephone Encounter (Signed)
Called patient to inform her that we had just been informed that lab had never picked up her urine specimen and that if she would like she could come to the clinic to recollect the specimen. Patient then told me that she had seen a gynecologist, the gynecologist collected a specimen and it was tested.She was prescribed and antibiotic and some vaginal cream, since the gynecologist told her she did have a urinary infection.

## 2020-04-21 NOTE — Telephone Encounter (Signed)
Noted thank you

## 2020-04-24 LAB — URINE CULTURE

## 2020-05-02 DIAGNOSIS — M545 Low back pain, unspecified: Secondary | ICD-10-CM | POA: Diagnosis not present

## 2020-05-02 DIAGNOSIS — R1031 Right lower quadrant pain: Secondary | ICD-10-CM | POA: Diagnosis not present

## 2020-05-02 DIAGNOSIS — M79604 Pain in right leg: Secondary | ICD-10-CM | POA: Diagnosis not present

## 2020-05-22 ENCOUNTER — Encounter: Payer: Self-pay | Admitting: Internal Medicine

## 2020-05-22 ENCOUNTER — Ambulatory Visit: Payer: Medicare PPO | Admitting: Internal Medicine

## 2020-05-22 ENCOUNTER — Other Ambulatory Visit: Payer: Self-pay

## 2020-05-22 VITALS — BP 138/86 | HR 80 | Temp 97.1°F | Ht 64.0 in | Wt 163.0 lb

## 2020-05-22 DIAGNOSIS — I358 Other nonrheumatic aortic valve disorders: Secondary | ICD-10-CM

## 2020-05-22 DIAGNOSIS — Z7189 Other specified counseling: Secondary | ICD-10-CM

## 2020-05-22 DIAGNOSIS — I10 Essential (primary) hypertension: Secondary | ICD-10-CM | POA: Diagnosis not present

## 2020-05-22 DIAGNOSIS — Z0000 Encounter for general adult medical examination without abnormal findings: Secondary | ICD-10-CM

## 2020-05-22 DIAGNOSIS — Z85820 Personal history of malignant melanoma of skin: Secondary | ICD-10-CM

## 2020-05-22 LAB — CBC
HCT: 37.8 % (ref 36.0–46.0)
Hemoglobin: 12.7 g/dL (ref 12.0–15.0)
MCHC: 33.6 g/dL (ref 30.0–36.0)
MCV: 84.8 fl (ref 78.0–100.0)
Platelets: 369 10*3/uL (ref 150.0–400.0)
RBC: 4.46 Mil/uL (ref 3.87–5.11)
RDW: 15.9 % — ABNORMAL HIGH (ref 11.5–15.5)
WBC: 4.1 10*3/uL (ref 4.0–10.5)

## 2020-05-22 LAB — COMPREHENSIVE METABOLIC PANEL
ALT: 17 U/L (ref 0–35)
AST: 23 U/L (ref 0–37)
Albumin: 4.3 g/dL (ref 3.5–5.2)
Alkaline Phosphatase: 69 U/L (ref 39–117)
BUN: 14 mg/dL (ref 6–23)
CO2: 29 mEq/L (ref 19–32)
Calcium: 9.7 mg/dL (ref 8.4–10.5)
Chloride: 93 mEq/L — ABNORMAL LOW (ref 96–112)
Creatinine, Ser: 0.83 mg/dL (ref 0.40–1.20)
GFR: 66.16 mL/min (ref >60.00)
Glucose, Bld: 102 mg/dL — ABNORMAL HIGH (ref 70–99)
Potassium: 4.3 mEq/L (ref 3.5–5.1)
Sodium: 130 mEq/L — ABNORMAL LOW (ref 135–145)
Total Bilirubin: 0.7 mg/dL (ref 0.2–1.2)
Total Protein: 7.6 g/dL (ref 6.0–8.3)

## 2020-05-22 NOTE — Assessment & Plan Note (Signed)
See social history 

## 2020-05-22 NOTE — Assessment & Plan Note (Signed)
Keeps up with dermatologist--now local

## 2020-05-22 NOTE — Assessment & Plan Note (Signed)
BP Readings from Last 3 Encounters:  05/22/20 138/86  04/20/20 (!) 150/70  04/07/20 120/80   Good control on the losartan HCTZ and amlodipine Will check labs

## 2020-05-22 NOTE — Assessment & Plan Note (Signed)
I have personally reviewed the Medicare Annual Wellness questionnaire and have noted 1. The patient's medical and social history 2. Their use of alcohol, tobacco or illicit drugs 3. Their current medications and supplements 4. The patient's functional ability including ADL's, fall risks, home safety risks and hearing or visual             impairment. 5. Diet and physical activities 6. Evidence for depression or mood disorders  The patients weight, height, BMI and visual acuity have been recorded in the chart I have made referrals, counseling and provided education to the patient based review of the above and I have provided the pt with a written personalized care plan for preventive services.  I have provided you with a copy of your personalized plan for preventive services. Please take the time to review along with your updated medication list.  Healthy Discussed adding back resistance exercise Will get COVID booster soon Had shingrix No cancer screening due to age

## 2020-05-22 NOTE — Assessment & Plan Note (Signed)
Typical aortic murmur but had echo before moving here and was told no significant issues Will monitor only Repeat echo if dizziness or more symptoms

## 2020-05-22 NOTE — Progress Notes (Signed)
Subjective:    Patient ID: Barbara Murphy, female    DOB: 02/12/1941, 79 y.o.   MRN: [id number]  HPI Here for Medicare wellness visit and follow up of chronic health conditions This visit occurred during the SARS-CoV-2 public health emergency.  Safety protocols were in place, including screening questions prior to the visit, additional usage of staff PPE, and extensive cleaning of exam room while observing appropriate contact time as indicated for disinfecting solutions.   Reviewed advanced directives Reviewed other doctors--Dr Moye--dermatologist, Dr Rexene Alberts, Dr Quintella Reichert No hospitalizations or surgery this year Vision and hearing are fine Enjoys a glass of wine with dinner nightly No falls No depression or anhedonia Independent with instrumental ADLs No memory problems  Has had some slight dizziness Hasn't been doing the exercise classes---not dealing with the sign-ups, etc Does walk mostly Back in the social swing at [facility] to the beach with sons  Continues on BP med Checks occasionally No chest pain or SOB Slight balance issues but no syncope No edema No palpitations  Keeps up with dermatologist  No recurrence of melanoma  Takes supplements for her general health  Current Outpatient Medications on File Prior to Visit  Medication Sig Dispense Refill  . Alpha-Lipoic Acid 200 MG CAPS Take 1 capsule by mouth daily.    Marland Kitchen amLODipine (NORVASC) 5 MG tablet TAKE 1 TABLET EVERY DAY 90 tablet 3  . b complex vitamins tablet Take 1 tablet by mouth daily.    . Calcium Carbonate (CALCIUM 600 PO) Take 1 tablet by mouth daily.    . Coenzyme Q10 200 MG capsule Take 200 mg by mouth daily.    Marland Kitchen losartan-hydrochlorothiazide (HYZAAR) 100-25 MG tablet TAKE 1 TABLET EVERY DAY 90 tablet 0  . Magnesium 200 MG TABS Take 1 tablet by mouth daily.    . Multiple Vitamin (MULTIVITAMIN) tablet Take 1 tablet by mouth daily.    . Multiple Vitamins-Minerals (PRESERVISION  AREDS 2 PO) Take by mouth.    . Omega-3 Fatty Acids (FISH OIL) 1000 MG CAPS Take 1 capsule by mouth daily.    . Probiotic Product (PROBIOTIC PO) Take 1 capsule by mouth daily.    . Turmeric 500 MG CAPS Take by mouth.    . Vitamin D, Cholecalciferol, 1000 units CAPS Take 2 capsules by mouth.     No current facility-administered medications on file prior to visit.    Allergies  Allergen Reactions  . Sulfa Antibiotics Hives  . Other   . Metoprolol Other (See Comments)    Personality change, fatigue    Past Medical History:  Diagnosis Date  . Hypertension   . Melanoma ([place])   . Melanoma in situ ([place]) 07/2018   left arm    Past Surgical History:  Procedure Laterality Date  . [place]   benign  . CARDIOVASCULAR STRESS TEST  01/2015   Nuclear---negative  . DEXA  2005   normal  . MELANOMA EXCISION     multiple--- [facility]  . STRABISMUS SURGERY  1980  . TONSILLECTOMY      Family History  Problem Relation Age of Onset  . Cancer Mother        liver  . Parkinson's disease Father   . Diabetes Brother   . Heart disease Brother   . Stroke Sister   . Heart disease Sister     Social History   Socioeconomic History  . Marital status: Widowed    Spouse name: Not  on file  . Number of children: 7  . Years of education: Not on file  . Highest education level: Not on file  Occupational History  . 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 Smoking status: Former Smoker    Packs/day: 0.25    Types: Cigarettes    Quit date: 07/25/1968    Years since quitting: 51.8  . Smokeless tobacco: Never Used  Substance and Sexual Activity  . Alcohol use: Not on file  . Drug use: Not on file  . Sexual activity: Not on file  Other Topics Concern  . Not on file  Social History Narrative   Second marriage-- 44. Widowed 2020   2 children, 5 stepchildren   Multiple grandchildren and great grandchildren      Has living  will   Son Gerald Stabs has health care POA   Would accept resuscitation attempts   Would leave tube feeds up to son   Social Determinants of Health   Financial Resource Strain:   . Difficulty of Paying Living Expenses: Not on file  Food Insecurity:   . Worried About Charity fundraiser in the Last Year: Not on file  . Ran Out of Food in the Last Year: Not on file  Transportation Needs:   . Lack of Transportation (Medical): Not on file  . Lack of Transportation (Non-Medical): Not on file  Physical Activity:   . Days of Exercise per Week: Not on file  . Minutes of Exercise per Session: Not on file  Stress:   . Feeling of Stress : Not on file  Social Connections:   . Frequency of Communication with Friends and Family: Not on file  . Frequency of Social Gatherings with Friends and Family: Not on file  . Attends Religious Services: Not on file  . Active Member of Clubs or Organizations: Not on file  . Attends Archivist Meetings: Not on file  . Marital Status: Not on file  Intimate Partner Violence:   . Fear of Current or Ex-Partner: Not on file  . Emotionally Abused: Not on file  . Physically Abused: Not on file  . Sexually Abused: Not on file   Review of Systems Did have recent UTI/cystitis Went to clinic --worried about appendicitis. Turned out to be sciatica. Appetite is good Watching weight--relatively stable Doesn't sleep well--not new. Usually gets at least 6 hours a night Wears seat belt No heartburn or dysphagia Bowels move regularly--no blood No current urinary symptoms---- uses liner for slight urge incontinence No sig back or joint pains    Objective:   Physical Exam Constitutional:      Appearance: Normal appearance.  HENT:     Mouth/Throat:     Comments: No lesions Eyes:     Conjunctiva/sclera: Conjunctivae normal.     Pupils: Pupils are equal, round, and reactive to light.  Cardiovascular:     Rate and Rhythm: Normal rate and regular rhythm.      Pulses: Normal pulses.     Heart sounds: No gallop.      Comments: Gr 2/6 coarse aortic systolic murmur Pulmonary:     Effort: Pulmonary effort is normal.     Breath sounds: Normal breath sounds. No wheezing or rales.  Abdominal:     Palpations: Abdomen is soft.     Tenderness: There is no abdominal tenderness.  Musculoskeletal:     Right lower leg: No edema.     Left lower leg:  No edema.  Skin:    General: Skin is warm.     Findings: No rash.  Neurological:     Mental Status: She is alert and oriented to person, place, and time.     Comments: President--- "Biden, Trump, Obama" 100-93-86=79-72-65 [id number] Recall 3/3  Psychiatric:        Mood and Affect: Mood normal.        Behavior: Behavior normal.            Assessment & Plan:

## 2020-06-02 ENCOUNTER — Ambulatory Visit: Payer: Medicare PPO | Admitting: Dermatology

## 2020-08-13 ENCOUNTER — Other Ambulatory Visit: Payer: Self-pay | Admitting: Internal Medicine

## 2020-08-13 DIAGNOSIS — H353132 Nonexudative age-related macular degeneration, bilateral, intermediate dry stage: Secondary | ICD-10-CM | POA: Diagnosis not present

## 2020-08-13 DIAGNOSIS — H43822 Vitreomacular adhesion, left eye: Secondary | ICD-10-CM | POA: Diagnosis not present

## 2020-08-13 DIAGNOSIS — H35371 Puckering of macula, right eye: Secondary | ICD-10-CM | POA: Diagnosis not present

## 2020-10-08 ENCOUNTER — Ambulatory Visit: Payer: Medicare PPO | Admitting: Dermatology

## 2020-10-08 ENCOUNTER — Other Ambulatory Visit: Payer: Self-pay

## 2020-10-08 DIAGNOSIS — L821 Other seborrheic keratosis: Secondary | ICD-10-CM | POA: Diagnosis not present

## 2020-10-08 DIAGNOSIS — L570 Actinic keratosis: Secondary | ICD-10-CM

## 2020-10-08 DIAGNOSIS — L578 Other skin changes due to chronic exposure to nonionizing radiation: Secondary | ICD-10-CM | POA: Diagnosis not present

## 2020-10-08 DIAGNOSIS — L814 Other melanin hyperpigmentation: Secondary | ICD-10-CM | POA: Diagnosis not present

## 2020-10-08 DIAGNOSIS — Z85820 Personal history of malignant melanoma of skin: Secondary | ICD-10-CM

## 2020-10-08 DIAGNOSIS — Z85828 Personal history of other malignant neoplasm of skin: Secondary | ICD-10-CM

## 2020-10-08 DIAGNOSIS — Z86018 Personal history of other benign neoplasm: Secondary | ICD-10-CM | POA: Diagnosis not present

## 2020-10-08 DIAGNOSIS — D229 Melanocytic nevi, unspecified: Secondary | ICD-10-CM

## 2020-10-08 DIAGNOSIS — Z1283 Encounter for screening for malignant neoplasm of skin: Secondary | ICD-10-CM

## 2020-10-08 DIAGNOSIS — L219 Seborrheic dermatitis, unspecified: Secondary | ICD-10-CM | POA: Diagnosis not present

## 2020-10-08 DIAGNOSIS — D1800 Hemangioma unspecified site: Secondary | ICD-10-CM

## 2020-10-08 NOTE — Patient Instructions (Addendum)
If you have any questions or concerns for your doctor, please call our main line at [phone] and press option 4 to reach your doctor's medical assistant. If no one answers, please leave a voicemail as directed and we will return your call as soon as possible. Messages left after 4 pm will be answered the following business day.   You may also send us a message via MyChart. We typically respond to MyChart messages within 1-2 business days.  For prescription refills, please ask your pharmacy to contact our office. Our fax number is [phone].  If you have an urgent issue when the clinic is closed that cannot wait until the next business day, you can page your doctor at the number below.    Please note that while we do our best to be available for urgent issues outside of office hours, we are not available 24/7.   If you have an urgent issue and are unable to reach us, you may choose to seek medical care at your doctor's office, retail clinic, urgent care center, or emergency room.  If you have a medical emergency, please immediately call 911 or go to the emergency department.  Pager Numbers  - Dr. Kowalski: [phone]  - Dr. Moye: [phone]  - Dr. Stewart: [phone]  In the event of inclement weather, please call our main line at [phone] for an update on the status of any delays or closures.  Dermatology Medication Tips: Please keep the boxes that topical medications come in in order to help keep track of the instructions about where and how to use these. Pharmacies typically print the medication instructions only on the boxes and not directly on the medication tubes.   If your medication is too expensive, please contact our office at [phone] option 4 or send us a message through MyChart.   We are unable to tell what your co-pay for medications will be in advance as this is different depending on your insurance coverage. However, we may be able to find a substitute  medication at lower cost or fill out paperwork to get insurance to cover a needed medication.   If a prior authorization is required to get your medication covered by your insurance company, please allow us 1-2 business days to complete this process.  Drug prices often vary depending on where the prescription is filled and some pharmacies may offer cheaper prices.  The website www.goodrx.com contains coupons for medications through different pharmacies. The prices here do not account for what the cost may be with help from insurance (it may be cheaper with your insurance), but the website can give you the price if you did not use any insurance.  - You can print the associated coupon and take it with your prescription to the pharmacy.  - You may also stop by our office during regular business hours and pick up a GoodRx coupon card.  - If you need your prescription sent electronically to a different pharmacy, notify our office through [address] MyChart or by phone at [phone] option 4.  Recommend taking Heliocare sun protection supplement daily in sunny weather for additional sun protection. For maximum protection on the sunniest days, you can take up to 2 capsules of regular Heliocare OR take 1 capsule of Heliocare Ultra. For prolonged exposure (such as a full day in the sun), you can repeat your dose of the supplement 4 hours after your first dose. Heliocare can be purchased at [facility] or at www.[email or web address].    

## 2020-10-08 NOTE — Progress Notes (Signed)
Follow-Up Visit   Subjective  Barbara Murphy is a 80 y.o. female who presents for the following: Annual Exam (Hx MM, BCC, AK's - patient has noticed a crusted lesion on her right cheek that she would like checked today ).  Patient here for full body skin exam and skin cancer screening.  She also has some dry areas at the scalp.  The following portions of the chart were reviewed this encounter and updated as appropriate:   Tobacco  Allergies  Meds  Problems  Med Hx  Surg Hx  Fam Hx     Review of Systems:  No other skin or systemic complaints except as noted in HPI or Assessment and Plan.  Objective  Well appearing patient in no apparent distress; mood and affect are within normal limits.  A full examination was performed including scalp, head, eyes, ears, nose, lips, neck, chest, axillae, abdomen, back, buttocks, bilateral upper extremities, bilateral lower extremities, hands, feet, fingers, toes, fingernails, and toenails. All findings within normal limits unless otherwise noted below.  Objective  R med cheek x 1, R Jaw x 2, L hand x 7, L forearm x 2 (12): Erythematous thin papules/macules with gritty scale.   Objective  Scalp: Pink patches with greasy scale.   Assessment & Plan  AK (actinic keratosis) (12) R med cheek x 1, R Jaw x 2, L hand x 7, L forearm x 2  Discussed topical chemotherapeutic creams, but patient has had bad experiences with those in the past and declines that type of treatment today.   Prior to procedure, discussed risks of blister formation, small wound, skin dyspigmentation, or rare scar following cryotherapy.  Destruction of lesion - R med cheek x 1, R Jaw x 2, L hand x 7, L forearm x 2 Complexity: simple   Destruction method: cryotherapy   Informed consent: discussed and consent obtained   Timeout:  patient name, date of birth, surgical site, and procedure verified Lesion destroyed using liquid nitrogen: Yes   Region frozen until ice ball  extended beyond lesion: Yes   Outcome: patient tolerated procedure well with no complications   Post-procedure details: wound care instructions given    Seborrheic dermatitis Scalp  Chronic condition with duration or expected duration over one year. Condition is bothersome to patient. Currently flared.  Discussed prescription therapy with ketoconazole shampoo and topical steroid as needed. Pt defers prescriptions today.  Will use OTC Selsun Blue or H&S. Apply three times per week, massage into scalp and leave in for 10 minutes before rinsing out.    Lentigines - Scattered tan macules - Due to sun exposure - Benign-appering, observe - Recommend daily broad spectrum sunscreen SPF 30+ to sun-exposed areas, reapply every 2 hours as needed. - Call for any changes  Seborrheic Keratoses - Stuck-on, waxy, tan-brown papules and plaques  - Discussed benign etiology and prognosis. - Observe - Call for any changes  Melanocytic Nevi - Tan-brown and/or pink-flesh-colored symmetric macules and papules - Benign appearing on exam today - Observation - Call clinic for new or changing moles - Recommend daily use of broad spectrum spf 30+ sunscreen to sun-exposed areas.   Hemangiomas - Red papules - Discussed benign nature - Observe - Call for any changes  Actinic Damage - Chronic, secondary to cumulative UV/sun exposure - diffuse scaly erythematous macules with underlying dyspigmentation - Recommend daily broad spectrum sunscreen SPF 30+ to sun-exposed areas, reapply every 2 hours as needed.  - Call for new or changing lesions. -  Recommend taking Heliocare sun protection supplement daily in sunny weather for additional sun protection. For maximum protection on the sunniest days, you can take up to 2 capsules of regular Heliocare OR take 1 capsule of Heliocare Ultra. For prolonged exposure (such as a full day in the sun), you can repeat your dose of the supplement 4 hours after your first  dose. Heliocare can be purchased at [facility] or at [email or web address].   History of Basal Cell Carcinoma of the Skin - R forearm  - No evidence of recurrence today - Recommend regular full body skin exams - Recommend daily broad spectrum sunscreen SPF 30+ to sun-exposed areas, reapply every 2 hours as needed.  - Call if any new or changing lesions are noted between office visits  History of Melanoma - L cheek lentigo maligna 2009, L post upper arm 2012, L lat prox upper arm 2019 - No evidence of recurrence today - No lymphadenopathy - Recommend regular full body skin exams - Recommend daily broad spectrum sunscreen SPF 30+ to sun-exposed areas, reapply every 2 hours as needed.  - Call if any new or changing lesions are noted between office visits  History of Dysplastic Nevus - L upper back (moderate to severe) - No evidence of recurrence today - Recommend regular full body skin exams - Recommend daily broad spectrum sunscreen SPF 30+ to sun-exposed areas, reapply every 2 hours as needed.  - Call if any new or changing lesions are noted between office visits  Skin cancer screening performed today.  Return in about 6 months (around 04/10/2021) for TBSE.  Luther Redo, CMA, am acting as scribe for Forest Gleason, MD .  Documentation: I have reviewed the above documentation for accuracy and completeness, and I agree with the above.  Forest Gleason, MD

## 2020-10-19 ENCOUNTER — Encounter: Payer: Self-pay | Admitting: Dermatology

## 2021-02-11 DIAGNOSIS — H353132 Nonexudative age-related macular degeneration, bilateral, intermediate dry stage: Secondary | ICD-10-CM | POA: Diagnosis not present

## 2021-02-11 DIAGNOSIS — Z9841 Cataract extraction status, right eye: Secondary | ICD-10-CM | POA: Diagnosis not present

## 2021-02-11 DIAGNOSIS — H53043 Amblyopia suspect, bilateral: Secondary | ICD-10-CM | POA: Diagnosis not present

## 2021-02-11 DIAGNOSIS — H43822 Vitreomacular adhesion, left eye: Secondary | ICD-10-CM | POA: Diagnosis not present

## 2021-02-11 DIAGNOSIS — Z9842 Cataract extraction status, left eye: Secondary | ICD-10-CM | POA: Diagnosis not present

## 2021-02-11 DIAGNOSIS — H52213 Irregular astigmatism, bilateral: Secondary | ICD-10-CM | POA: Diagnosis not present

## 2021-04-14 ENCOUNTER — Ambulatory Visit: Payer: Medicare PPO | Admitting: Dermatology

## 2021-04-14 ENCOUNTER — Other Ambulatory Visit: Payer: Self-pay

## 2021-04-14 DIAGNOSIS — L578 Other skin changes due to chronic exposure to nonionizing radiation: Secondary | ICD-10-CM

## 2021-04-14 DIAGNOSIS — Z85820 Personal history of malignant melanoma of skin: Secondary | ICD-10-CM

## 2021-04-14 DIAGNOSIS — Z85828 Personal history of other malignant neoplasm of skin: Secondary | ICD-10-CM | POA: Diagnosis not present

## 2021-04-14 DIAGNOSIS — Z86018 Personal history of other benign neoplasm: Secondary | ICD-10-CM

## 2021-04-14 DIAGNOSIS — D1800 Hemangioma unspecified site: Secondary | ICD-10-CM

## 2021-04-14 DIAGNOSIS — D229 Melanocytic nevi, unspecified: Secondary | ICD-10-CM | POA: Diagnosis not present

## 2021-04-14 DIAGNOSIS — L814 Other melanin hyperpigmentation: Secondary | ICD-10-CM | POA: Diagnosis not present

## 2021-04-14 DIAGNOSIS — L821 Other seborrheic keratosis: Secondary | ICD-10-CM

## 2021-04-14 DIAGNOSIS — L570 Actinic keratosis: Secondary | ICD-10-CM

## 2021-04-14 DIAGNOSIS — D485 Neoplasm of uncertain behavior of skin: Secondary | ICD-10-CM

## 2021-04-14 DIAGNOSIS — Z1283 Encounter for screening for malignant neoplasm of skin: Secondary | ICD-10-CM | POA: Diagnosis not present

## 2021-04-14 DIAGNOSIS — D489 Neoplasm of uncertain behavior, unspecified: Secondary | ICD-10-CM

## 2021-04-14 DIAGNOSIS — Z872 Personal history of diseases of the skin and subcutaneous tissue: Secondary | ICD-10-CM

## 2021-04-14 HISTORY — DX: Actinic keratosis: L57.0

## 2021-04-14 NOTE — Patient Instructions (Addendum)
Recommend Alastin Restoritive Skin Complex to use at face      Biopsy Wound Care Instructions  Leave the original bandage on for 24 hours if possible.  If the bandage becomes soaked or soiled before that time, it is OK to remove it and examine the wound.  A small amount of post-operative bleeding is normal.  If excessive bleeding occurs, remove the bandage, place gauze over the site and apply continuous pressure (no peeking) over the area for 30 minutes. If this does not work, please call our clinic as soon as possible or page your doctor if it is after hours.   Once a day, cleanse the wound with soap and water. It is fine to shower. If a thick crust develops you may use a Q-tip dipped into dilute hydrogen peroxide (mix 1:1 with water) to dissolve it.  Hydrogen peroxide can slow the healing process, so use it only as needed.    After washing, apply petroleum jelly (Vaseline) or an antibiotic ointment if your doctor prescribed one for you, followed by a bandage.    For best healing, the wound should be covered with a layer of ointment at all times. If you are not able to keep the area covered with a bandage to hold the ointment in place, this may mean re-applying the ointment several times a day.  Continue this wound care until the wound has healed and is no longer open.   Itching and mild discomfort is normal during the healing process. However, if you develop pain or severe itching, please call our office.   If you have any discomfort, you can take Tylenol (acetaminophen) or ibuprofen as directed on the bottle. (Please do not take these if you have an allergy to them or cannot take them for another reason).  Some redness, tenderness and white or yellow material in the wound is normal healing.  If the area becomes very sore and red, or develops a thick yellow-green material (pus), it may be infected; please notify Barbara Murphy.    If you have stitches, return to clinic as directed to have the stitches  removed. You will continue wound care for 2-3 days after the stitches are removed.   Wound healing continues for up to one year following surgery. It is not unusual to experience pain in the scar from time to time during the interval.  If the pain becomes severe or the scar thickens, you should notify the office.    A slight amount of redness in a scar is expected for the first six months.  After six months, the redness will fade and the scar will soften and fade.  The color difference becomes less noticeable with time.  If there are any problems, return for a post-op surgery check at your earliest convenience.  To improve the appearance of the scar, you can use silicone scar gel, cream, or sheets (such as Mederma or Serica) every night for up to one year. These are available over the counter (without a prescription).  Please call our office at [phone] for any questions or concerns.        Actinic keratoses are precancerous spots that appear secondary to cumulative UV radiation exposure/sun exposure over time. They are chronic with expected duration over 1 year. A portion of actinic keratoses will progress to squamous cell carcinoma of the skin. It is not possible to reliably predict which spots will progress to skin cancer and so treatment is recommended to prevent development of skin cancer.  Recommend daily broad spectrum sunscreen SPF 30+ to sun-exposed areas, reapply every 2 hours as needed.  Recommend staying in the shade or wearing long sleeves, sun glasses (UVA+UVB protection) and wide brim hats (4-inch brim around the entire circumference of the hat). Call for new or changing lesions.   Cryotherapy Aftercare  Wash gently with soap and water everyday.   Apply Vaseline and Band-Aid daily until healed.   Melanoma ABCDEs  Melanoma is the most dangerous type of skin cancer, and is the leading cause of death from skin disease.  You are more likely to develop melanoma if  you: Have light-colored skin, light-colored eyes, or red or blond hair Spend a lot of time in the sun Tan regularly, either outdoors or in a tanning bed Have had blistering sunburns, especially during childhood Have a close family member who has had a melanoma Have atypical moles or large birthmarks  Early detection of melanoma is key since treatment is typically straightforward and cure rates are extremely high if we catch it early.   The first sign of melanoma is often a change in a mole or a new dark spot.  The ABCDE system is a way of remembering the signs of melanoma.  A for asymmetry:  The two halves do not match. B for border:  The edges of the growth are irregular. C for color:  A mixture of colors are present instead of an even brown color. D for diameter:  Melanomas are usually (but not always) greater than 70mm - the size of a pencil eraser. E for evolution:  The spot keeps changing in size, shape, and color.  Please check your skin once per month between visits. You can use a small mirror in front and a large mirror behind you to keep an eye on the back side or your body.   If you see any new or changing lesions before your next follow-up, please call to schedule a visit.  Please continue daily skin protection including broad spectrum sunscreen SPF 30+ to sun-exposed areas, reapplying every 2 hours as needed when you're outdoors.   Staying in the shade or wearing long sleeves, sun glasses (UVA+UVB protection) and wide brim hats (4-inch brim around the entire circumference of the hat) are also recommended for sun protection.    If you have any questions or concerns for your doctor, please call our main line at [phone] and press option 4 to reach your doctor's medical assistant. If no one answers, please leave a voicemail as directed and we will return your call as soon as possible. Messages left after 4 pm will be answered the following business day.   You may also send Barbara Murphy  a message via [place]. We typically respond to MyChart messages within 1-2 business days.  For prescription refills, please ask your pharmacy to contact our office. Our fax number is [phone].  If you have an urgent issue when the clinic is closed that cannot wait until the next business day, you can page your doctor at the number below.    Please note that while we do our best to be available for urgent issues outside of office hours, we are not available 24/7.   If you have an urgent issue and are unable to reach Barbara Murphy, you may choose to seek medical care at your doctor's office, retail clinic, urgent care center, or emergency room.  If you have a medical emergency, please immediately call 911 or go to the emergency department.  Pager Numbers  - Dr. Nehemiah Massed: [phone]  - Dr. Laurence Ferrari: [phone]  - Dr. Nicole Kindred: [phone]  In the event of inclement weather, please call our main line at [phone] for an update on the status of any delays or closures.  Dermatology Medication Tips: Please keep the boxes that topical medications come in in order to help keep track of the instructions about where and how to use these. Pharmacies typically print the medication instructions only on the boxes and not directly on the medication tubes.   If your medication is too expensive, please contact our office at [phone] option 4 or send Barbara Murphy a message through [place].   We are unable to tell what your co-pay for medications will be in advance as this is different depending on your insurance coverage. However, we may be able to find a substitute medication at lower cost or fill out paperwork to get insurance to cover a needed medication.   If a prior authorization is required to get your medication covered by your insurance company, please allow Barbara Murphy 1-2 business days to complete this process.  Drug prices often vary depending on where the prescription is filled and some pharmacies may offer  cheaper prices.  The website www.goodrx.com contains coupons for medications through different pharmacies. The prices here do not account for what the cost may be with help from insurance (it may be cheaper with your insurance), but the website can give you the price if you did not use any insurance.  - You can print the associated coupon and take it with your prescription to the pharmacy.  - You may also stop by our office during regular business hours and pick up a GoodRx coupon card.  - If you need your prescription sent electronically to a different pharmacy, notify our office through [facility] or by phone at [phone] option 4.

## 2021-04-14 NOTE — Progress Notes (Signed)
Follow-Up Visit   Subjective  Barbara Murphy is a 80 y.o. female who presents for the following: Follow-up (Patient here today for 6 months. Patient states that she has an area on nose that is a little bumpy. Patient reports she has been taking heliocare. She has a history of aks. ).  Patient here for full body skin exam and skin cancer screening.  The following portions of the chart were reviewed this encounter and updated as appropriate:  Tobacco  Allergies  Meds  Problems  Med Hx  Surg Hx  Fam Hx      Review of Systems: No other skin or systemic complaints except as noted in HPI or Assessment and Plan.   Objective  Well appearing patient in no apparent distress; mood and affect are within normal limits.  A full examination was performed including scalp, head, eyes, ears, nose, lips, neck, chest, axillae, abdomen, back, buttocks, bilateral upper extremities, bilateral lower extremities, hands, feet, fingers, toes, fingernails, and toenails. All findings within normal limits unless otherwise noted below.  right cheek x 1, left helix x 1, right upper arm x 1, right chest x 1, right dorsal hands x 1, left forearm x 1, left posterior calf x 1 (7) Erythematous thin papules/macules with gritty scale.   right forearm Excoriated thin pink papule   left shoulder 0.5 cm erythematous papule with prominent telangectasia   Assessment & Plan  Actinic keratosis (7) right cheek x 1, left helix x 1, right upper arm x 1, right chest x 1, right dorsal hands x 1, left forearm x 1, left posterior calf x 1  Actinic keratoses are precancerous spots that appear secondary to cumulative UV radiation exposure/sun exposure over time. They are chronic with expected duration over 1 year. A portion of actinic keratoses will progress to squamous cell carcinoma of the skin. It is not possible to reliably predict which spots will progress to skin cancer and so treatment is recommended to prevent development  of skin cancer.  Recommend daily broad spectrum sunscreen SPF 30+ to sun-exposed areas, reapply every 2 hours as needed.  Recommend staying in the shade or wearing long sleeves, sun glasses (UVA+UVB protection) and wide brim hats (4-inch brim around the entire circumference of the hat). Call for new or changing lesions.  Destruction of lesion - right cheek x 1, left helix x 1, right upper arm x 1, right chest x 1, right dorsal hands x 1, left forearm x 1, left posterior calf x 1  Destruction method: cryotherapy   Informed consent: discussed and consent obtained   Lesion destroyed using liquid nitrogen: Yes   Cryotherapy cycles:  2 Outcome: patient tolerated procedure well with no complications   Post-procedure details: wound care instructions given   Additional details:  Prior to procedure, discussed risks of blister formation, small wound, skin dyspigmentation, or rare scar following cryotherapy. Recommend Vaseline ointment to treated areas while healing.   Neoplasm of uncertain behavior (2) right forearm  left shoulder  Skin / nail biopsy Type of biopsy: tangential   Informed consent: discussed and consent obtained   Patient was prepped and draped in usual sterile fashion: Area prepped with alcohol. Anesthesia: the lesion was anesthetized in a standard fashion   Anesthetic:  1% lidocaine w/ epinephrine 1-100,000 buffered w/ 8.4% NaHCO3 Instrument used: flexible razor blade   Hemostasis achieved with: pressure, aluminum chloride and electrodesiccation   Outcome: patient tolerated procedure well   Post-procedure details: wound care instructions given  Post-procedure details comment:  Ointment and small bandage applied  Specimen 1 - Surgical pathology Differential Diagnosis: r/o bcc   Check Margins: No  Right forearm - History of trauma; Favor healing wound ; Advised to call if doesn't clear in a month  Lentigines - Scattered tan macules - Due to sun exposure -  Benign-appearing, observe - Recommend daily broad spectrum sunscreen SPF 30+ to sun-exposed areas, reapply every 2 hours as needed. - Call for any changes  Seborrheic Keratoses - Stuck-on, waxy, tan-brown papules and/or plaques  - Benign-appearing - Discussed benign etiology and prognosis. - Observe - Call for any changes  Melanocytic Nevi - Tan-brown and/or pink-flesh-colored symmetric macules and papules - Benign appearing on exam today - Observation - Call clinic for new or changing moles - Recommend daily use of broad spectrum spf 30+ sunscreen to sun-exposed areas.   Hemangiomas - Red papules - Discussed benign nature - Observe - Call for any changes  Actinic Damage - Chronic condition, secondary to cumulative UV/sun exposure - diffuse scaly erythematous macules with underlying dyspigmentation - Recommend daily broad spectrum sunscreen SPF 30+ to sun-exposed areas, reapply every 2 hours as needed.  - Staying in the shade or wearing long sleeves, sun glasses (UVA+UVB protection) and wide brim hats (4-inch brim around the entire circumference of the hat) are also recommended for sun protection.  - Call for new or changing lesions.  History of Basal Cell Carcinoma of the Skin - No evidence of recurrence today at right forearm  - Recommend regular full body skin exams - Recommend daily broad spectrum sunscreen SPF 30+ to sun-exposed areas, reapply every 2 hours as needed.  - Call if any new or changing lesions are noted between office visits  History of Melanoma - No evidence of recurrence today  at left cheek lentigo maligna (2009), left posterior upper arm (2012), left lateral proximal upper arm (2019) - No lymphadenopathy - Recommend regular full body skin exams - Recommend daily broad spectrum sunscreen SPF 30+ to sun-exposed areas, reapply every 2 hours as needed.  - Call if any new or changing lesions are noted between office visits  History of Dysplastic Nevi - No  evidence of recurrence today Left upper back mod to severe  - Recommend regular full body skin exams - Recommend daily broad spectrum sunscreen SPF 30+ to sun-exposed areas, reapply every 2 hours as needed.  - Call if any new or changing lesions are noted between office visits  Skin cancer screening performed today.  Return in about 6 months (around 10/12/2021) for tbse. I, Ruthell Rummage, CMA, am acting as scribe for Forest Gleason, MD.  Documentation: I have reviewed the above documentation for accuracy and completeness, and I agree with the above.  Forest Gleason, MD

## 2021-04-19 ENCOUNTER — Telehealth: Payer: Self-pay

## 2021-04-19 NOTE — Telephone Encounter (Signed)
-----   Message from Alfonso Patten, MD sent at 04/17/2021  3:53 PM EDT ----- Skin , left shoulder LICHENOID ACTINIC KERATOSIS --> LN2 in clinic  MAs please call. Thank you!

## 2021-04-19 NOTE — Telephone Encounter (Signed)
Left pt msg to call for bx results/sh 

## 2021-04-20 ENCOUNTER — Encounter: Payer: Self-pay | Admitting: Dermatology

## 2021-04-22 ENCOUNTER — Telehealth: Payer: Self-pay

## 2021-04-22 NOTE — Telephone Encounter (Signed)
-----   Message from Alfonso Patten, MD sent at 04/17/2021  3:53 PM EDT ----- Skin , left shoulder LICHENOID ACTINIC KERATOSIS --> LN2 in clinic  MAs please call. Thank you!

## 2021-04-22 NOTE — Telephone Encounter (Signed)
Called patient to review bx results but someone from office had already spoke with her and scheduled her for LN2./js

## 2021-05-11 ENCOUNTER — Other Ambulatory Visit: Payer: Self-pay

## 2021-05-11 ENCOUNTER — Ambulatory Visit: Payer: Medicare PPO | Admitting: Dermatology

## 2021-05-11 ENCOUNTER — Encounter: Payer: Self-pay | Admitting: Dermatology

## 2021-05-11 DIAGNOSIS — L570 Actinic keratosis: Secondary | ICD-10-CM

## 2021-05-11 NOTE — Patient Instructions (Addendum)
Cryotherapy Aftercare  Wash gently with soap and water everyday.   Apply Vaseline and Band-Aid daily until healed.   Prior to procedure, discussed risks of blister formation, small wound, skin dyspigmentation, or rare scar following cryotherapy. Recommend Vaseline ointment to treated areas while healing.  Recommend daily broad spectrum sunscreen SPF 30+ to sun-exposed areas, reapply every 2 hours as needed. Call for new or changing lesions.  Staying in the shade or wearing long sleeves, sun glasses (UVA+UVB protection) and wide brim hats (4-inch brim around the entire circumference of the hat) are also recommended for sun protection.   If you have any questions or concerns for your doctor, please call our main line at [phone] and press option 4 to reach your doctor's medical assistant. If no one answers, please leave a voicemail as directed and we will return your call as soon as possible. Messages left after 4 pm will be answered the following business day.   You may also send us a message via MyChart. We typically respond to MyChart messages within 1-2 business days.  For prescription refills, please ask your pharmacy to contact our office. Our fax number is [phone].  If you have an urgent issue when the clinic is closed that cannot wait until the next business day, you can page your doctor at the number below.    Please note that while we do our best to be available for urgent issues outside of office hours, we are not available 24/7.   If you have an urgent issue and are unable to reach us, you may choose to seek medical care at your doctor's office, retail clinic, urgent care center, or emergency room.  If you have a medical emergency, please immediately call 911 or go to the emergency department.  Pager Numbers  - Dr. Kowalski: [phone]  - Dr. Moye: [phone]  - Dr. Stewart: [phone]  In the event of inclement weather, please call our main line at  [phone] for an update on the status of any delays or closures.  Dermatology Medication Tips: Please keep the boxes that topical medications come in in order to help keep track of the instructions about where and how to use these. Pharmacies typically print the medication instructions only on the boxes and not directly on the medication tubes.   If your medication is too expensive, please contact our office at [phone] option 4 or send us a message through MyChart.   We are unable to tell what your co-pay for medications will be in advance as this is different depending on your insurance coverage. However, we may be able to find a substitute medication at lower cost or fill out paperwork to get insurance to cover a needed medication.   If a prior authorization is required to get your medication covered by your insurance company, please allow us 1-2 business days to complete this process.  Drug prices often vary depending on where the prescription is filled and some pharmacies may offer cheaper prices.  The website www.goodrx.com contains coupons for medications through different pharmacies. The prices here do not account for what the cost may be with help from insurance (it may be cheaper with your insurance), but the website can give you the price if you did not use any insurance.  - You can print the associated coupon and take it with your prescription to the pharmacy.  - You may also stop by our office during regular business hours and pick up a GoodRx coupon card.  -   If you need your prescription sent electronically to a different pharmacy, notify our office through [facility] MyChart or by phone at [phone] option 4.  

## 2021-05-11 NOTE — Progress Notes (Signed)
   Follow-Up Visit   Subjective  Barbara Murphy is a 80 y.o. female who presents for the following: Follow-up (Patient here today to treat bx proven AK at left shoulder. ).   The following portions of the chart were reviewed this encounter and updated as appropriate:   Tobacco  Allergies  Meds  Problems  Med Hx  Surg Hx  Fam Hx      Review of Systems:  No other skin or systemic complaints except as noted in HPI or Assessment and Plan.  Objective  Well appearing patient in no apparent distress; mood and affect are within normal limits.  A focused examination was performed including left shoulder. Relevant physical exam findings are noted in the Assessment and Plan.  Left shoulder Pink papule   Assessment & Plan  AK (actinic keratosis) Left shoulder  Prior to procedure, discussed risks of blister formation, small wound, skin dyspigmentation, or rare scar following cryotherapy. Recommend Vaseline ointment to treated areas while healing.   Destruction of lesion - Left shoulder  Destruction method: cryotherapy   Informed consent: discussed and consent obtained   Lesion destroyed using liquid nitrogen: Yes   Cryotherapy cycles:  2 Outcome: patient tolerated procedure well with no complications   Post-procedure details: wound care instructions given    Return for TBSE, as scheduled.  Graciella Belton, RMA, am acting as scribe for Forest Gleason, MD .  Documentation: I have reviewed the above documentation for accuracy and completeness, and I agree with the above.  Forest Gleason, MD

## 2021-05-25 ENCOUNTER — Other Ambulatory Visit: Payer: Self-pay

## 2021-05-25 ENCOUNTER — Encounter: Payer: Self-pay | Admitting: Internal Medicine

## 2021-05-25 ENCOUNTER — Ambulatory Visit (INDEPENDENT_AMBULATORY_CARE_PROVIDER_SITE_OTHER): Payer: Medicare PPO | Admitting: Internal Medicine

## 2021-05-25 VITALS — BP 130/84 | HR 63 | Temp 96.4°F | Ht 64.0 in | Wt 169.0 lb

## 2021-05-25 DIAGNOSIS — I10 Essential (primary) hypertension: Secondary | ICD-10-CM

## 2021-05-25 DIAGNOSIS — M25511 Pain in right shoulder: Secondary | ICD-10-CM | POA: Diagnosis not present

## 2021-05-25 DIAGNOSIS — M7541 Impingement syndrome of right shoulder: Secondary | ICD-10-CM | POA: Insufficient documentation

## 2021-05-25 DIAGNOSIS — I358 Other nonrheumatic aortic valve disorders: Secondary | ICD-10-CM | POA: Diagnosis not present

## 2021-05-25 DIAGNOSIS — Z0000 Encounter for general adult medical examination without abnormal findings: Secondary | ICD-10-CM | POA: Diagnosis not present

## 2021-05-25 DIAGNOSIS — Z7189 Other specified counseling: Secondary | ICD-10-CM | POA: Diagnosis not present

## 2021-05-25 LAB — CBC
HCT: 39.9 % (ref 36.0–46.0)
Hemoglobin: 12.9 g/dL (ref 12.0–15.0)
MCHC: 32.4 g/dL (ref 30.0–36.0)
MCV: 86.6 fl (ref 78.0–100.0)
Platelets: 329 10*3/uL (ref 150.0–400.0)
RBC: 4.61 Mil/uL (ref 3.87–5.11)
RDW: 14.7 % (ref 11.5–15.5)
WBC: 4.9 10*3/uL (ref 4.0–10.5)

## 2021-05-25 LAB — COMPREHENSIVE METABOLIC PANEL
ALT: 17 U/L (ref 0–35)
AST: 23 U/L (ref 0–37)
Albumin: 4.5 g/dL (ref 3.5–5.2)
Alkaline Phosphatase: 54 U/L (ref 39–117)
BUN: 16 mg/dL (ref 6–23)
CO2: 29 mEq/L (ref 19–32)
Calcium: 9.5 mg/dL (ref 8.4–10.5)
Chloride: 92 mEq/L — ABNORMAL LOW (ref 96–112)
Creatinine, Ser: 0.6 mg/dL (ref 0.40–1.20)
GFR: 83.64 mL/min (ref >60.00)
Glucose, Bld: 105 mg/dL — ABNORMAL HIGH (ref 70–99)
Potassium: 4.2 mEq/L (ref 3.5–5.1)
Sodium: 129 mEq/L — ABNORMAL LOW (ref 135–145)
Total Bilirubin: 1.1 mg/dL (ref 0.2–1.2)
Total Protein: 7.4 g/dL (ref 6.0–8.3)

## 2021-05-25 NOTE — Assessment & Plan Note (Signed)
I have personally reviewed the Medicare Annual Wellness questionnaire and have noted 1. The patient's medical and social history 2. Their use of alcohol, tobacco or illicit drugs 3. Their current medications and supplements 4. The patient's functional ability including ADL's, fall risks, home safety risks and hearing or visual             impairment. 5. Diet and physical activities 6. Evidence for depression or mood disorders  The patients weight, height, BMI and visual acuity have been recorded in the chart I have made referrals, counseling and provided education to the patient based review of the above and I have provided the pt with a written personalized care plan for preventive services.  I have provided you with a copy of your personalized plan for preventive services. Please take the time to review along with your updated medication list.  Done with cancer screening Is exercising regularly Had flu vaccine and bivalent COVID

## 2021-05-25 NOTE — Assessment & Plan Note (Signed)
See social history 

## 2021-05-25 NOTE — Assessment & Plan Note (Signed)
Good control on amlodipine and losartan HCTZ Will check labs

## 2021-05-25 NOTE — Assessment & Plan Note (Signed)
Slight impingement Will plan cortisone injection

## 2021-05-25 NOTE — Progress Notes (Signed)
Subjective:    Patient ID: Barbara Murphy, female    DOB: 26-Sep-1940, 80 y.o.   MRN: [id number]  HPI Here for Medicare wellness visit and follow up of chronic health conditions This visit occurred during the SARS-CoV-2 public health emergency.  Safety protocols were in place, including screening questions prior to the visit, additional usage of staff PPE, and extensive cleaning of exam room while observing appropriate contact time as indicated for disinfecting solutions.   Reviewed advanced directives Reviewed other doctors---Dr Moye--derm, Dr Quintella Reichert, Dr Rexene Alberts No hospitalizations or surgery in past year Vision is okay---does have dry MD but stable Hearing is fine Enjoys wine with dinner No tobacco Regular exercise No falls No depression --mostly over her grieving. No anhedonia Has great social interaction at [facility] with instrumental ADLs No memory issues--names are a problem  Did have injury to right shoulder 2 years ago Ongoing problems Wonders about PT---is going for class with stretching but limited effectiveness Advil helps---but doesn't use regularly  Continues on the BP meds Checks with the [facility] RN---usually 130's/80's No headache  No chest pain or SOB No palpitations No edema  Having some right thumb pain Is taking tumeric for her joints Did have shots in her neck-that pain is better (had been keeping her head flexed--so now more careful with positioning)  Current Outpatient Medications on File Prior to Visit  Medication Sig Dispense Refill   Alpha-Lipoic Acid 200 MG CAPS Take 1 capsule by mouth daily.     amLODipine (NORVASC) 5 MG tablet TAKE 1 TABLET EVERY DAY 90 tablet 3   b complex vitamins tablet Take 1 tablet by mouth daily.     Calcium Carbonate (CALCIUM 600 PO) Take 1 tablet by mouth daily.     Coenzyme Q10 200 MG capsule Take 200 mg by mouth daily.     losartan-hydrochlorothiazide (HYZAAR) 100-25 MG tablet TAKE  1 TABLET EVERY DAY 90 tablet 3   Magnesium 200 MG TABS Take 1 tablet by mouth daily.     Multiple Vitamins-Minerals (PRESERVISION AREDS 2 PO) Take by mouth.     Omega-3 Fatty Acids (FISH OIL) 1000 MG CAPS Take 1 capsule by mouth daily.     Probiotic Product (PROBIOTIC PO) Take 1 capsule by mouth daily.     Turmeric 500 MG CAPS Take by mouth.     Vitamin D, Cholecalciferol, 1000 units CAPS Take 2 capsules by mouth.     No current facility-administered medications on file prior to visit.    Allergies  Allergen Reactions   Sulfa Antibiotics Hives   Other    Metoprolol Other (See Comments)    Personality change, fatigue    Past Medical History:  Diagnosis Date   Actinic keratosis 78/67/6720   L shoulder, Lichenoid AK, needs LN2   Hypertension    Melanoma ([place])    Melanoma in situ ([place]) 07/2018   left arm    Past Surgical History:  Procedure Laterality Date   BREAST BIOPSY  1962 & 1980   benign   CARDIOVASCULAR STRESS TEST  01/2015   Nuclear---negative   DEXA  2005   normal   MELANOMA EXCISION     multiple--- [facility]   STRABISMUS SURGERY  1980   TONSILLECTOMY      Family History  Problem Relation Age of Onset   Cancer Mother        liver   Parkinson's disease Father    Diabetes Brother    Heart disease Brother  Stroke Sister    Heart disease Sister     Social History   Socioeconomic History   Marital status: Widowed    Spouse name: Not on file   Number of children: 7   Years of education: Not on file   Highest education level: Not on file  Occupational History   Occupation: Pharmacist, 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 Widowed 2020   2 children, 5 stepchildren   Multiple grandchildren and great grandchildren      Has living will   Son Gerald Stabs has health care POA   Would accept resuscitation attempts   Would leave tube feeds up to son   Social Determinants of Health   Financial Resource Strain: Not on file  Food Insecurity: Not on file  Transportation Needs: Not on file  Physical Activity: Not on file  Stress: Not on file  Social Connections: Not on file  Intimate Partner Violence: Not on file   Review of Systems Appetite is fine Weight is up a little Sleeps well Wears seat belt Teeth are okay---keeps up with dentist Sees derm twice a year No heartburn or dysphagia Bowels are fine--- no blood Mild urge incontinence--wears pads. No dysuria No other joint or back pain    Objective:   Physical Exam Constitutional:      Appearance: Normal appearance.  HENT:     Mouth/Throat:     Comments: No lesions Eyes:     Conjunctiva/sclera: Conjunctivae normal.     Pupils: Pupils are equal, round, and reactive to light.  Cardiovascular:     Rate and Rhythm: Normal rate and regular rhythm.     Heart sounds:    No gallop.     Comments: 2+ pulse in right foot---faint on left Stable gr 3/6 aortic systolic murmur Pulmonary:     Effort: Pulmonary effort is normal.     Breath sounds: Normal breath sounds. No wheezing or rales.  Abdominal:     Palpations: Abdomen is soft.     Tenderness: There is no abdominal tenderness.  Musculoskeletal:     Right lower leg: No edema.     Left lower leg: No edema.     Comments: Mild restriction in abduction and external rotation of right shoulder. No tenderness but does have a clunk  Skin:    Findings: No lesion or rash.  Neurological:     Mental Status: She is alert and oriented to person, place, and time.     Comments: President---"Biden, Trump, Obama" [id number] Recall 3/3   Psychiatric:        Mood and Affect: Mood normal.        Behavior: Behavior normal.           Assessment & Plan:

## 2021-05-25 NOTE — Assessment & Plan Note (Signed)
Stable murmur No action needed

## 2021-05-25 NOTE — Progress Notes (Signed)
Hearing Screening - Comments:: Passed whisper test °Vision Screening - Comments:: August 2022 ° °

## 2021-05-31 ENCOUNTER — Encounter: Payer: Self-pay | Admitting: Internal Medicine

## 2021-05-31 ENCOUNTER — Other Ambulatory Visit: Payer: Self-pay

## 2021-05-31 ENCOUNTER — Ambulatory Visit: Payer: Medicare PPO | Admitting: Internal Medicine

## 2021-05-31 DIAGNOSIS — M7541 Impingement syndrome of right shoulder: Secondary | ICD-10-CM | POA: Diagnosis not present

## 2021-05-31 NOTE — Progress Notes (Signed)
Subjective:    Patient ID: Barbara Murphy, female    DOB: 29-May-1941, 80 y.o.   MRN: [id number]  HPI Here for injection in her right shoulder This visit occurred during the SARS-CoV-2 public health emergency.  Safety protocols were in place, including screening questions prior to the visit, additional usage of staff PPE, and extensive cleaning of exam room while observing appropriate contact time as indicated for disinfecting solutions.   Has mild impingement Discussed injection at last visit  Current Outpatient Medications on File Prior to Visit  Medication Sig Dispense Refill   Alpha-Lipoic Acid 200 MG CAPS Take 1 capsule by mouth daily.     amLODipine (NORVASC) 5 MG tablet TAKE 1 TABLET EVERY DAY 90 tablet 3   b complex vitamins tablet Take 1 tablet by mouth daily.     Calcium Carbonate (CALCIUM 600 PO) Take 1 tablet by mouth daily.     Coenzyme Q10 200 MG capsule Take 200 mg by mouth daily.     losartan-hydrochlorothiazide (HYZAAR) 100-25 MG tablet TAKE 1 TABLET EVERY DAY 90 tablet 3   Magnesium 200 MG TABS Take 1 tablet by mouth daily.     Multiple Vitamins-Minerals (PRESERVISION AREDS 2 PO) Take by mouth.     Omega-3 Fatty Acids (FISH OIL) 1000 MG CAPS Take 1 capsule by mouth daily.     Probiotic Product (PROBIOTIC PO) Take 1 capsule by mouth daily.     Turmeric 500 MG CAPS Take by mouth.     Vitamin D, Cholecalciferol, 1000 units CAPS Take 2 capsules by mouth.     No current facility-administered medications on file prior to visit.    Allergies  Allergen Reactions   Sulfa Antibiotics Hives   Other    Metoprolol Other (See Comments)    Personality change, fatigue    Past Medical History:  Diagnosis Date   Actinic keratosis 66/44/0347   L shoulder, Lichenoid AK, needs LN2   Hypertension    Melanoma ([place])    Melanoma in situ ([place]) 07/2018   left arm    Past Surgical History:  Procedure Laterality Date   BREAST BIOPSY  1962 & 1980   benign   CARDIOVASCULAR STRESS  TEST  01/2015   Nuclear---negative   DEXA  2005   normal   MELANOMA EXCISION     multiple--- [facility]   STRABISMUS SURGERY  1980   TONSILLECTOMY      Family History  Problem Relation Age of Onset   Cancer Mother        liver   Parkinson's disease Father    Diabetes Brother    Heart disease Brother    Stroke Sister    Heart disease Sister     Social History   Socioeconomic History   Marital status: Widowed    Spouse name: Not on file   Number of children: 7   Years of education: Not on file   Highest education level: Not on file  Occupational History   Occupation: Pharmacist, 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 Widowed 2020   2 children, 5 stepchildren   Multiple grandchildren and great grandchildren      Has living will   Son Gerald Stabs has health care POA   Would accept resuscitation attempts   Would leave tube feeds up to son   Social Determinants of Health   Financial Resource Strain: Not on file  Food Insecurity: Not on file  Transportation Needs: Not on file  Physical Activity: Not on file  Stress: Not on file  Social Connections: Not on file  Intimate Partner Violence: Not on file   Review of Systems     Objective:   Physical Exam Musculoskeletal:     Comments: Right shoulder--no bursa tenderness Some impingement with external rotation especially  Mildly limited abduction           Assessment & Plan:

## 2021-05-31 NOTE — Assessment & Plan Note (Signed)
Discussed treatment alternatives and pros/cons of cortisone injection She gave verbal consent  Posterior approach to right shoulder Sterile prep 1.5cc 2% plain lidocaine after ethyl chloride 40mg  trimacinolone and 5cc plain 2% lidocaine instilled without difficulty Tolerated well Did not notice any immediate effect Discussed care at home  If no better, will make referral for PT at [facility]

## 2021-06-16 ENCOUNTER — Other Ambulatory Visit: Payer: Self-pay | Admitting: Internal Medicine

## 2021-08-10 DIAGNOSIS — H35371 Puckering of macula, right eye: Secondary | ICD-10-CM | POA: Diagnosis not present

## 2021-08-10 DIAGNOSIS — H43822 Vitreomacular adhesion, left eye: Secondary | ICD-10-CM | POA: Diagnosis not present

## 2021-08-10 DIAGNOSIS — H353132 Nonexudative age-related macular degeneration, bilateral, intermediate dry stage: Secondary | ICD-10-CM | POA: Diagnosis not present

## 2021-08-13 ENCOUNTER — Other Ambulatory Visit: Payer: Self-pay | Admitting: Internal Medicine

## 2021-09-06 ENCOUNTER — Ambulatory Visit (INDEPENDENT_AMBULATORY_CARE_PROVIDER_SITE_OTHER)
Admission: RE | Admit: 2021-09-06 | Discharge: 2021-09-06 | Disposition: A | Discharge status: Home / Self Care | Payer: Medicare PPO | Source: Ambulatory Visit | Attending: Internal Medicine | Admitting: Internal Medicine

## 2021-09-06 ENCOUNTER — Other Ambulatory Visit: Payer: Self-pay

## 2021-09-06 ENCOUNTER — Ambulatory Visit (INDEPENDENT_AMBULATORY_CARE_PROVIDER_SITE_OTHER): Payer: Medicare PPO | Admitting: Internal Medicine

## 2021-09-06 ENCOUNTER — Encounter: Payer: Self-pay | Admitting: Internal Medicine

## 2021-09-06 VITALS — BP 102/68 | HR 97 | Temp 97.8°F | Ht 64.0 in | Wt 174.0 lb

## 2021-09-06 DIAGNOSIS — M7541 Impingement syndrome of right shoulder: Secondary | ICD-10-CM

## 2021-09-06 DIAGNOSIS — I10 Essential (primary) hypertension: Secondary | ICD-10-CM | POA: Diagnosis not present

## 2021-09-06 DIAGNOSIS — M19011 Primary osteoarthritis, right shoulder: Secondary | ICD-10-CM | POA: Diagnosis not present

## 2021-09-06 MED ORDER — LOSARTAN POTASSIUM-HCTZ 100-25 MG PO TABS: 0.5000 | ORAL_TABLET | Freq: Every day | ORAL | 0 refills | Status: DC

## 2021-09-06 NOTE — Patient Instructions (Signed)
Please try cutting the losartan/HCTZ 100/25 in half and only take 1/2 tab daily. Monitor your blood pressure and symptoms and give me an update in a couple of weeks.

## 2021-09-06 NOTE — Assessment & Plan Note (Signed)
Not sure if sequelae of injury---bony vs rotator cuff damage in the past Will check x-ray Prefers no orthopedic evaluation at this point Will set up with PT as well

## 2021-09-06 NOTE — Assessment & Plan Note (Signed)
BP Readings from Last 3 Encounters:  09/06/21 102/68  05/31/21 116/78  05/25/21 130/84   She has had some issues with balance and dizziness upon first standing Will have her try to cut the losartan/HCTZ 100/25 in half for now Continue the amlodipine 5mg  for now

## 2021-09-06 NOTE — Progress Notes (Signed)
Subjective:    Patient ID: Barbara Murphy, female    DOB: 01/01/1941, 81 y.o.   MRN: [id number]  HPI Here due to ongoing right shoulder pain  Didn't note any improvement with the cortisone injection Trouble lifting arm up at all Lots of pain even trying to put on coat  Still doing exercises in class---some help Worse when she missed classes Will occasionally take 400mg  ibuprofen (once a day)---does help  Initial injury during pandemic--so never had it evaluated Has improved somewhat  Current Outpatient Medications on File Prior to Visit  Medication Sig Dispense Refill   Alpha-Lipoic Acid 200 MG CAPS Take 1 capsule by mouth daily.     amLODipine (NORVASC) 5 MG tablet TAKE 1 TABLET EVERY DAY 90 tablet 3   b complex vitamins tablet Take 1 tablet by mouth daily.     Calcium Carbonate (CALCIUM 600 PO) Take 1 tablet by mouth daily.     Coenzyme Q10 200 MG capsule Take 200 mg by mouth daily.     losartan-hydrochlorothiazide (HYZAAR) 100-25 MG tablet TAKE 1 TABLET EVERY DAY 90 tablet 3   Magnesium 200 MG TABS Take 1 tablet by mouth daily.     Multiple Vitamins-Minerals (PRESERVISION AREDS 2 PO) Take by mouth.     Omega-3 Fatty Acids (FISH OIL) 1000 MG CAPS Take 1 capsule by mouth daily.     Probiotic Product (PROBIOTIC PO) Take 1 capsule by mouth daily.     Turmeric 500 MG CAPS Take by mouth.     Vitamin D, Cholecalciferol, 1000 units CAPS Take 2 capsules by mouth.     No current facility-administered medications on file prior to visit.    Allergies  Allergen Reactions   Sulfa Antibiotics Hives   Other    Metoprolol Other (See Comments)    Personality change, fatigue    Past Medical History:  Diagnosis Date   Actinic keratosis 71/16/5790   L shoulder, Lichenoid AK, needs LN2   Hypertension    Melanoma ([place])    Melanoma in situ ([place]) 07/2018   left arm    Past Surgical History:  Procedure Laterality Date   BREAST BIOPSY  1962 & 1980   benign   CARDIOVASCULAR STRESS  TEST  01/2015   Nuclear---negative   DEXA  2005   normal   MELANOMA EXCISION     multiple--- [facility]   STRABISMUS SURGERY  1980   TONSILLECTOMY      Family History  Problem Relation Age of Onset   Cancer Mother        liver   Parkinson's disease Father    Diabetes Brother    Heart disease Brother    Stroke Sister    Heart disease Sister     Social History   Socioeconomic History   Marital status: Widowed    Spouse name: Not on file   Number of children: 7   Years of education: Not on file   Highest education level: Not on file  Occupational History   Occupation: Pharmacist, 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 Widowed 2020   2 children, 5 stepchildren   Multiple grandchildren and great grandchildren      Has living will   Son Gerald Stabs has health care POA   Would accept resuscitation attempts   Would leave tube feeds up to son   Social Determinants of Health   Financial Resource Strain: Not on file  Food Insecurity: Not on file  Transportation Needs: Not on file  Physical Activity: Not on file  Stress: Not on file  Social Connections: Not on file  Intimate Partner Violence: Not on file   Review of Systems Has had some dizziness when taking walk--usually very quickly    Objective:   Physical Exam Constitutional:      Appearance: Normal appearance.  Musculoskeletal:     Comments: Active abduction of right shoulder to 90 degrees Internal rotation is fairly good but clunk and limitations early in passive external rotation  Neurological:     Mental Status: She is alert.            Assessment & Plan:

## 2021-09-15 ENCOUNTER — Other Ambulatory Visit: Payer: Self-pay

## 2021-09-15 ENCOUNTER — Ambulatory Visit: Payer: Medicare PPO | Admitting: Dermatology

## 2021-09-15 DIAGNOSIS — L814 Other melanin hyperpigmentation: Secondary | ICD-10-CM

## 2021-09-15 DIAGNOSIS — R21 Rash and other nonspecific skin eruption: Secondary | ICD-10-CM

## 2021-09-15 DIAGNOSIS — D1800 Hemangioma unspecified site: Secondary | ICD-10-CM | POA: Diagnosis not present

## 2021-09-15 DIAGNOSIS — L570 Actinic keratosis: Secondary | ICD-10-CM | POA: Diagnosis not present

## 2021-09-15 DIAGNOSIS — Z85828 Personal history of other malignant neoplasm of skin: Secondary | ICD-10-CM | POA: Diagnosis not present

## 2021-09-15 DIAGNOSIS — D229 Melanocytic nevi, unspecified: Secondary | ICD-10-CM | POA: Diagnosis not present

## 2021-09-15 DIAGNOSIS — L578 Other skin changes due to chronic exposure to nonionizing radiation: Secondary | ICD-10-CM

## 2021-09-15 DIAGNOSIS — L821 Other seborrheic keratosis: Secondary | ICD-10-CM

## 2021-09-15 DIAGNOSIS — Z85820 Personal history of malignant melanoma of skin: Secondary | ICD-10-CM | POA: Diagnosis not present

## 2021-09-15 DIAGNOSIS — L259 Unspecified contact dermatitis, unspecified cause: Secondary | ICD-10-CM | POA: Diagnosis not present

## 2021-09-15 DIAGNOSIS — Z86018 Personal history of other benign neoplasm: Secondary | ICD-10-CM

## 2021-09-15 MED ORDER — TRIAMCINOLONE ACETONIDE 0.1 % EX CREA: TOPICAL_CREAM | CUTANEOUS | 0 refills | Status: DC

## 2021-09-15 NOTE — Patient Instructions (Addendum)
Topical steroids (such as triamcinolone, fluocinolone, fluocinonide, mometasone, clobetasol, halobetasol, betamethasone, hydrocortisone) can cause thinning and lightening of the skin if they are used for too long in the same area. Your physician has selected the right strength medicine for your problem and area affected on the body. Please use your medication only as directed by your physician to prevent side effects.     Actinic keratoses are precancerous spots that appear secondary to cumulative UV radiation exposure/sun exposure over time. They are chronic with expected duration over 1 year. A portion of actinic keratoses will progress to squamous cell carcinoma of the skin. It is not possible to reliably predict which spots will progress to skin cancer and so treatment is recommended to prevent development of skin cancer.  Recommend daily broad spectrum sunscreen SPF 30+ to sun-exposed areas, reapply every 2 hours as needed.  Recommend staying in the shade or wearing long sleeves, sun glasses (UVA+UVB protection) and wide brim hats (4-inch brim around the entire circumference of the hat). Call for new or changing lesions.   Cryotherapy Aftercare  Wash gently with soap and water everyday.   Apply Vaseline and Band-Aid daily until healed.    Recommend Niacinamide or Nicotinamide 500mg  twice per day to lower risk of non-melanoma skin cancer by approximately 25%. This is usually available at [employer].    Recommend taking Heliocare sun protection supplement daily in sunny weather for additional sun protection. For maximum protection on the sunniest days, you can take up to 2 capsules of regular Heliocare OR take 1 capsule of Heliocare Ultra. For prolonged exposure (such as a full day in the sun), you can repeat your dose of the supplement 4 hours after your first dose. Heliocare can be purchased at [employer], at some [facility] or at [email or web address].     Melanoma  ABCDEs  Melanoma is the most dangerous type of skin cancer, and is the leading cause of death from skin disease.  You are more likely to develop melanoma if you: Have light-colored skin, light-colored eyes, or red or blond hair Spend a lot of time in the sun Tan regularly, either outdoors or in a tanning bed Have had blistering sunburns, especially during childhood Have a close family member who has had a melanoma Have atypical moles or large birthmarks  Early detection of melanoma is key since treatment is typically straightforward and cure rates are extremely high if we catch it early.   The first sign of melanoma is often a change in a mole or a new dark spot.  The ABCDE system is a way of remembering the signs of melanoma.  A for asymmetry:  The two halves do not match. B for border:  The edges of the growth are irregular. C for color:  A mixture of colors are present instead of an even brown color. D for diameter:  Melanomas are usually (but not always) greater than 53mm - the size of a pencil eraser. E for evolution:  The spot keeps changing in size, shape, and color.  Please check your skin once per month between visits. You can use a small mirror in front and a large mirror behind you to keep an eye on the back side or your body.   If you see any new or changing lesions before your next follow-up, please call to schedule a visit.  Please continue daily skin protection including broad spectrum sunscreen SPF 30+ to sun-exposed areas, reapplying every 2 hours as needed when you're  outdoors.   Staying in the shade or wearing long sleeves, sun glasses (UVA+UVB protection) and wide brim hats (4-inch brim around the entire circumference of the hat) are also recommended for sun protection.   If You Need Anything After Your Visit  If you have any questions or concerns for your doctor, please call our main line at [phone] and press option 4 to reach your doctor's medical assistant. If  no one answers, please leave a voicemail as directed and we will return your call as soon as possible. Messages left after 4 pm will be answered the following business day.   You may also send [place] a message via [place]. We typically respond to MyChart messages within 1-2 business days.  For prescription refills, please ask your pharmacy to contact our office. Our fax number is [phone].  If you have an urgent issue when the clinic is closed that cannot wait until the next business day, you can page your doctor at the number below.    Please note that while we do our best to be available for urgent issues outside of office hours, we are not available 24/7.   If you have an urgent issue and are unable to reach [place], you may choose to seek medical care at your doctor's office, retail clinic, urgent care center, or emergency room.  If you have a medical emergency, please immediately call 911 or go to the emergency department.  Pager Numbers  - Dr. Nehemiah Massed: [phone]  - Dr. Laurence Ferrari: [phone]  - Dr. Nicole Kindred: [phone]  In the event of inclement weather, please call our main line at [phone] for an update on the status of any delays or closures.  Dermatology Medication Tips: Please keep the boxes that topical medications come in in order to help keep track of the instructions about where and how to use these. Pharmacies typically print the medication instructions only on the boxes and not directly on the medication tubes.   If your medication is too expensive, please contact our office at [phone] option 4 or send [place] a message through [place].   We are unable to tell what your co-pay for medications will be in advance as this is different depending on your insurance coverage. However, we may be able to find a substitute medication at lower cost or fill out paperwork to get insurance to cover a needed medication.   If a prior authorization is required to get your medication  covered by your insurance company, please allow [place] 1-2 business days to complete this process.  Drug prices often vary depending on where the prescription is filled and some pharmacies may offer cheaper prices.  The website www.goodrx.com contains coupons for medications through different pharmacies. The prices here do not account for what the cost may be with help from insurance (it may be cheaper with your insurance), but the website can give you the price if you did not use any insurance.  - You can print the associated coupon and take it with your prescription to the pharmacy.  - You may also stop by our office during regular business hours and pick up a GoodRx coupon card.  - If you need your prescription sent electronically to a different pharmacy, notify our office through [facility] or by phone at [phone] option 4.     Si Usted Necesita Algo Despus de Su Visita  Tambin puede enviarnos un mensaje a travs de Pharmacist, community. Por lo general respondemos a los mensajes de [employer]  en el transcurso de 1 a 2 das hbiles.  Para renovar recetas, por favor pida a su farmacia que se ponga en contacto con nuestra oficina. Harland Dingwall de fax es [place] [phone].  Si tiene un asunto urgente cuando la clnica est cerrada y que no puede esperar hasta el siguiente da hbil, puede llamar/localizar a su doctor(a) al nmero que aparece a continuacin.   Por favor, tenga en cuenta que aunque hacemos todo lo posible para estar disponibles para asuntos urgentes fuera del horario de [place], no estamos disponibles las 24 horas del da, los 7 das de la [place].   Si tiene un problema urgente y no puede comunicarse con nosotros, puede optar por buscar atencin mdica  en el consultorio de su doctor(a), en una clnica privada, en un centro de atencin urgente o en una sala de emergencias.  Si tiene Engineering geologist, por favor llame inmediatamente al 911 o vaya a la sala de  emergencias.  Nmeros de bper  - Dr. Nehemiah Massed: [phone]  - Dra. Moye: [phone]  - Dra. Nicole Kindred: [phone]  En caso de inclemencias del [place], por favor llame a Johnsie Kindred principal al [phone] para una actualizacin sobre el [place] de cualquier retraso o cierre.  Consejos para la medicacin en dermatologa: Por favor, guarde las cajas en las que vienen los medicamentos de uso tpico para ayudarle a seguir las instrucciones sobre dnde y cmo usarlos. Las farmacias generalmente imprimen las instrucciones del medicamento slo en las cajas y no directamente en los tubos del [place].   Si su medicamento es muy caro, por favor, pngase en contacto con Zigmund Daniel llamando al [phone] y presione la opcin 4 o envenos un mensaje a travs de Pharmacist, community.   No podemos decirle cul ser su copago por los medicamentos por adelantado ya que esto es diferente dependiendo de la cobertura de su seguro. Sin embargo, es posible que podamos encontrar un medicamento sustituto a Electrical engineer un formulario para que el seguro cubra el medicamento que se considera necesario.   Si se requiere una autorizacin previa para que su compaa de seguros [place] su medicamento, por favor permtanos de 1 a 2 das hbiles para completar este proceso.  Los precios de los medicamentos varan con frecuencia dependiendo del Environmental consultant de dnde se surte la receta y alguna farmacias pueden ofrecer precios ms baratos.  El sitio web www.goodrx.com tiene cupones para medicamentos de Airline pilot. Los precios aqu no tienen en cuenta lo que podra costar con la ayuda del seguro (puede ser ms barato con su seguro), pero el sitio web puede darle el precio si no utiliz Research scientist (physical sciences).  - Puede imprimir el cupn correspondiente y llevarlo con su receta a la farmacia.  - Tambin puede pasar por nuestra oficina durante el horario de atencin regular y Charity fundraiser una tarjeta de cupones de GoodRx.  - Si  necesita que su receta se enve electrnicamente a una farmacia diferente, informe a nuestra oficina a travs de MyChart de [place] o por telfono llamando al [phone] y presione la opcin 4.

## 2021-09-15 NOTE — Progress Notes (Signed)
Follow-Up Visit   Subjective  Barbara Murphy is a 81 y.o. female who presents for the following: Follow-up (Patient here today for tbse. Patient reports that she has some itchy bumps torso. Patient reports that she think may be coming from fabric. ).  Patient here for full body skin exam and skin cancer screening.  The following portions of the chart were reviewed this encounter and updated as appropriate:  Tobacco   Allergies   Meds   Problems   Med Hx   Surg Hx   Fam Hx       Review of Systems: No other skin or systemic complaints except as noted in HPI or Assessment and Plan.   Objective  Well appearing patient in no apparent distress; mood and affect are within normal limits.  A full examination was performed including scalp, head, eyes, ears, nose, lips, neck, chest, axillae, abdomen, back, buttocks, bilateral upper extremities, bilateral lower extremities, hands, feet, fingers, toes, fingernails, and toenails. All findings within normal limits unless otherwise noted below.  back, abdomen Excoriated pink papules  right dorsal hand x 2, right forearm x 1, left eyebrow x 2 (5) Erythematous thin papules/macules with gritty scale.    Assessment & Plan  Rash and other nonspecific skin eruption back, abdomen  Ddx grovers disease vs hypersensitivy vs papular eczema vs other   Start triamcinolone cream 0.1 % cream - apply topically to affected itchy areas at trunk and body twice daily for 2 weeks. Avoid applying to face, groin, and axilla. Use as directed.   Patient advised to call if not clear in 2 weeks.   Topical steroids (such as triamcinolone, fluocinolone, fluocinonide, mometasone, clobetasol, halobetasol, betamethasone, hydrocortisone) can cause thinning and lightening of the skin if they are used for too long in the same area. Your physician has selected the right strength medicine for your problem and area affected on the body. Please use your medication only as directed by  your physician to prevent side effects.    triamcinolone cream (KENALOG) 0.1 % - back, abdomen Apply topically to affected itchy areas at trunk and body twice daily for 2 weeks. Avoid applying to face, groin, and axilla. Use as directed  Actinic keratosis (5) right dorsal hand x 2, right forearm x 1, left eyebrow x 2  Actinic keratoses are precancerous spots that appear secondary to cumulative UV radiation exposure/sun exposure over time. They are chronic with expected duration over 1 year. A portion of actinic keratoses will progress to squamous cell carcinoma of the skin. It is not possible to reliably predict which spots will progress to skin cancer and so treatment is recommended to prevent development of skin cancer.  Recommend daily broad spectrum sunscreen SPF 30+ to sun-exposed areas, reapply every 2 hours as needed.  Recommend staying in the shade or wearing long sleeves, sun glasses (UVA+UVB protection) and wide brim hats (4-inch brim around the entire circumference of the hat). Call for new or changing lesions.  Destruction of lesion - right dorsal hand x 2, right forearm x 1, left eyebrow x 2  Destruction method: cryotherapy   Informed consent: discussed and consent obtained   Lesion destroyed using liquid nitrogen: Yes   Cryotherapy cycles:  2 Outcome: patient tolerated procedure well with no complications   Post-procedure details: wound care instructions given   Additional details:  Prior to procedure, discussed risks of blister formation, small wound, skin dyspigmentation, or rare scar following cryotherapy. Recommend Vaseline ointment to treated areas while healing.  Lentigines - Scattered tan macules - Due to sun exposure - Benign-appearing, observe - Recommend daily broad spectrum sunscreen SPF 30+ to sun-exposed areas, reapply every 2 hours as needed. - Call for any changes  Seborrheic Keratoses - Stuck-on, waxy, tan-brown papules and/or plaques  -  Benign-appearing - Discussed benign etiology and prognosis. - Observe - Call for any changes  Melanocytic Nevi - Tan-brown and/or pink-flesh-colored symmetric macules and papules - Benign appearing on exam today - Observation - Call clinic for new or changing moles - Recommend daily use of broad spectrum spf 30+ sunscreen to sun-exposed areas.   Hemangiomas - Red papules - Discussed benign nature - Observe - Call for any changes  Actinic Damage - Chronic condition, secondary to cumulative UV/sun exposure - diffuse scaly erythematous macules with underlying dyspigmentation - Recommend daily broad spectrum sunscreen SPF 30+ to sun-exposed areas, reapply every 2 hours as needed.  - Staying in the shade or wearing long sleeves, sun glasses (UVA+UVB protection) and wide brim hats (4-inch brim around the entire circumference of the hat) are also recommended for sun protection.  - Call for new or changing lesions.  History of Basal Cell Carcinoma of the Skin - No evidence of recurrence today at right forearm  - Recommend regular full body skin exams - Recommend daily broad spectrum sunscreen SPF 30+ to sun-exposed areas, reapply every 2 hours as needed.  - Call if any new or changing lesions are noted between office visits   History of Melanoma - No evidence of recurrence today  at left cheek lentigo maligna (2009), left posterior upper arm (2012), left lateral proximal upper arm (2019) - No lymphadenopathy - Recommend regular full body skin exams - Recommend daily broad spectrum sunscreen SPF 30+ to sun-exposed areas, reapply every 2 hours as needed.  - Call if any new or changing lesions are noted between office visits   History of Dysplastic Nevi - No evidence of recurrence today Left upper back mod to severe  - Recommend regular full body skin exams - Recommend daily broad spectrum sunscreen SPF 30+ to sun-exposed areas, reapply every 2 hours as needed.  - Call if any new or  changing lesions are noted between office visits   Skin cancer screening performed today. Return for 6 month tbse . I, Ruthell Rummage, CMA, am acting as scribe for Forest Gleason, MD.  Documentation: I have reviewed the above documentation for accuracy and completeness, and I agree with the above.  Forest Gleason, MD

## 2021-09-20 ENCOUNTER — Encounter: Payer: Self-pay | Admitting: Dermatology

## 2021-09-29 DIAGNOSIS — M7541 Impingement syndrome of right shoulder: Secondary | ICD-10-CM | POA: Diagnosis not present

## 2021-10-13 ENCOUNTER — Ambulatory Visit: Payer: Medicare PPO | Admitting: Dermatology

## 2021-10-26 DIAGNOSIS — M7541 Impingement syndrome of right shoulder: Secondary | ICD-10-CM | POA: Diagnosis not present

## 2021-10-28 DIAGNOSIS — M7541 Impingement syndrome of right shoulder: Secondary | ICD-10-CM | POA: Diagnosis not present

## 2021-11-08 DIAGNOSIS — M7541 Impingement syndrome of right shoulder: Secondary | ICD-10-CM | POA: Diagnosis not present

## 2021-11-10 DIAGNOSIS — M7541 Impingement syndrome of right shoulder: Secondary | ICD-10-CM | POA: Diagnosis not present

## 2021-11-15 DIAGNOSIS — M7541 Impingement syndrome of right shoulder: Secondary | ICD-10-CM | POA: Diagnosis not present

## 2021-11-19 DIAGNOSIS — M7541 Impingement syndrome of right shoulder: Secondary | ICD-10-CM | POA: Diagnosis not present

## 2021-11-24 DIAGNOSIS — M7541 Impingement syndrome of right shoulder: Secondary | ICD-10-CM | POA: Diagnosis not present

## 2021-11-29 DIAGNOSIS — M7541 Impingement syndrome of right shoulder: Secondary | ICD-10-CM | POA: Diagnosis not present

## 2021-12-09 DIAGNOSIS — M7541 Impingement syndrome of right shoulder: Secondary | ICD-10-CM | POA: Diagnosis not present

## 2021-12-15 DIAGNOSIS — M7541 Impingement syndrome of right shoulder: Secondary | ICD-10-CM | POA: Diagnosis not present

## 2021-12-23 DIAGNOSIS — M7541 Impingement syndrome of right shoulder: Secondary | ICD-10-CM | POA: Diagnosis not present

## 2022-01-06 DIAGNOSIS — M7541 Impingement syndrome of right shoulder: Secondary | ICD-10-CM | POA: Diagnosis not present

## 2022-01-20 DIAGNOSIS — M7541 Impingement syndrome of right shoulder: Secondary | ICD-10-CM | POA: Diagnosis not present

## 2022-02-04 DIAGNOSIS — M7541 Impingement syndrome of right shoulder: Secondary | ICD-10-CM | POA: Diagnosis not present

## 2022-02-07 DIAGNOSIS — H52213 Irregular astigmatism, bilateral: Secondary | ICD-10-CM | POA: Diagnosis not present

## 2022-02-07 DIAGNOSIS — H43822 Vitreomacular adhesion, left eye: Secondary | ICD-10-CM | POA: Diagnosis not present

## 2022-02-07 DIAGNOSIS — H353132 Nonexudative age-related macular degeneration, bilateral, intermediate dry stage: Secondary | ICD-10-CM | POA: Diagnosis not present

## 2022-02-07 DIAGNOSIS — H53043 Amblyopia suspect, bilateral: Secondary | ICD-10-CM | POA: Diagnosis not present

## 2022-02-07 DIAGNOSIS — Z9841 Cataract extraction status, right eye: Secondary | ICD-10-CM | POA: Diagnosis not present

## 2022-02-07 DIAGNOSIS — Z9842 Cataract extraction status, left eye: Secondary | ICD-10-CM | POA: Diagnosis not present

## 2022-02-17 DIAGNOSIS — M7541 Impingement syndrome of right shoulder: Secondary | ICD-10-CM | POA: Diagnosis not present

## 2022-03-24 ENCOUNTER — Ambulatory Visit: Payer: Medicare PPO | Admitting: Dermatology

## 2022-03-24 DIAGNOSIS — L814 Other melanin hyperpigmentation: Secondary | ICD-10-CM

## 2022-03-24 DIAGNOSIS — D1800 Hemangioma unspecified site: Secondary | ICD-10-CM

## 2022-03-24 DIAGNOSIS — L578 Other skin changes due to chronic exposure to nonionizing radiation: Secondary | ICD-10-CM

## 2022-03-24 DIAGNOSIS — L821 Other seborrheic keratosis: Secondary | ICD-10-CM

## 2022-03-24 DIAGNOSIS — D229 Melanocytic nevi, unspecified: Secondary | ICD-10-CM

## 2022-03-24 DIAGNOSIS — Z1283 Encounter for screening for malignant neoplasm of skin: Secondary | ICD-10-CM | POA: Diagnosis not present

## 2022-03-24 DIAGNOSIS — Z86006 Personal history of melanoma in-situ: Secondary | ICD-10-CM | POA: Diagnosis not present

## 2022-03-24 DIAGNOSIS — L570 Actinic keratosis: Secondary | ICD-10-CM | POA: Diagnosis not present

## 2022-03-24 MED ORDER — FLUOROURACIL 5 % EX CREA: TOPICAL_CREAM | CUTANEOUS | 0 refills | Status: DC

## 2022-03-24 NOTE — Patient Instructions (Addendum)
Treat left temple/top of cheek and right cheek/lower eyelid and mid cheek with 5-Fluorouracil    5-Fluorouracil Patient Education   Actinic keratoses are the dry, red scaly spots on the skin caused by sun damage. A portion of these spots can turn into skin cancer with time, and treating them can help prevent development of skin cancer.   Treatment of these spots requires removal of the defective skin cells. There are various ways to remove actinic keratoses, including freezing with liquid nitrogen, treatment with creams, or treatment with a blue light procedure in the office.   5-fluorouracil cream is a topical cream used to treat actinic keratoses. It works by interfering with the growth of abnormal fast-growing skin cells, such as actinic keratoses. These cells peel off and are replaced by healthy ones.   5-fluorouracil/calcipotriene is a combination of the 5-fluorouracil cream with a vitamin D analog cream called calcipotriene. The calcipotriene alone does not treat actinic keratoses. However, when it is combined with 5-fluorouracil, it helps the 5-fluorouracil treat the actinic keratoses much faster so that the same results can be achieved with a much shorter treatment time.  INSTRUCTIONS FOR 5-FLUOROURACIL CREAM:   5-fluorouracil/calcipotriene cream typically only needs to be used for 4-7 days. A thin layer should be applied twice a day to the treatment areas recommended by your physician.    Avoid contact with your eyes, nostrils, and mouth. Do not use 5-fluorouracil/calcipotriene cream on infected or open wounds.   You will develop redness, irritation and some crusting at areas where you have pre-cancer damage/actinic keratoses. IF YOU DEVELOP PAIN, BLEEDING, OR SIGNIFICANT CRUSTING, STOP THE TREATMENT EARLY - you have already gotten a good response and the actinic keratoses should clear up well.  Wash your hands after applying 5-fluorouracil 5% cream on your skin.   A moisturizer or  sunscreen with a minimum SPF 30 should be applied each morning.   Once you have finished the treatment, you can apply a thin layer of Vaseline twice a day to irritated areas to soothe and calm the areas more quickly. If you experience significant discomfort, contact your physician.  For some patients it is necessary to repeat the treatment for best results.  SIDE EFFECTS: When using 5-fluorouracil/calcipotriene cream, you may have mild irritation, such as redness, dryness, swelling, or a mild burning sensation. This usually resolves within 2 weeks. The more actinic keratoses you have, the more redness and inflammation you can expect during treatment. Eye irritation has been reported rarely. If this occurs, please let us know.   If you have any trouble using this cream, please call the office. If you have any other questions about this information, please do not hesitate to ask me before you leave the office.   Due to recent changes in healthcare laws, you may see results of your pathology and/or laboratory studies on MyChart before the doctors have had a chance to review them. We understand that in some cases there may be results that are confusing or concerning to you. Please understand that not all results are received at the same time and often the doctors may need to interpret multiple results in order to provide you with the best plan of care or course of treatment. Therefore, we ask that you please give [place] 2 business days to thoroughly review all your results before contacting the office for clarification. Should we see a critical lab result, you will be contacted sooner.   If You Need Anything After Your Visit  If  you have any questions or concerns for your doctor, please call our main line at [phone] and press option 4 to reach your doctor's medical assistant. If no one answers, please leave a voicemail as directed and we will return your call as soon as possible. Messages left after 4 pm  will be answered the following business day.   You may also send [place] a message via [place]. We typically respond to MyChart messages within 1-2 business days.  For prescription refills, please ask your pharmacy to contact our office. Our fax number is [phone].  If you have an urgent issue when the clinic is closed that cannot wait until the next business day, you can page your doctor at the number below.    Please note that while we do our best to be available for urgent issues outside of office hours, we are not available 24/7.   If you have an urgent issue and are unable to reach [place], you may choose to seek medical care at your doctor's office, retail clinic, urgent care center, or emergency room.  If you have a medical emergency, please immediately call 911 or go to the emergency department.  Pager Numbers  - Dr. Nehemiah Massed: [phone]  - Dr. Laurence Ferrari: [phone]  - Dr. Nicole Kindred: [phone]  In the event of inclement weather, please call our main line at [phone] for an update on the status of any delays or closures.  Dermatology Medication Tips: Please keep the boxes that topical medications come in in order to help keep track of the instructions about where and how to use these. Pharmacies typically print the medication instructions only on the boxes and not directly on the medication tubes.   If your medication is too expensive, please contact our office at [phone] option 4 or send [place] a message through [place].   We are unable to tell what your co-pay for medications will be in advance as this is different depending on your insurance coverage. However, we may be able to find a substitute medication at lower cost or fill out paperwork to get insurance to cover a needed medication.   If a prior authorization is required to get your medication covered by your insurance company, please allow [place] 1-2 business days to complete this process.  Drug prices often vary depending  on where the prescription is filled and some pharmacies may offer cheaper prices.  The website www.goodrx.com contains coupons for medications through different pharmacies. The prices here do not account for what the cost may be with help from insurance (it may be cheaper with your insurance), but the website can give you the price if you did not use any insurance.  - You can print the associated coupon and take it with your prescription to the pharmacy.  - You may also stop by our office during regular business hours and pick up a GoodRx coupon card.  - If you need your prescription sent electronically to a different pharmacy, notify our office through [facility] or by phone at [phone] option 4.     Si Usted Necesita Algo Despus de Su Visita  Tambin puede enviarnos un mensaje a travs de Pharmacist, community. Por lo general respondemos a los mensajes de MyChart en el transcurso de 1 a 2 das hbiles.  Para renovar recetas, por favor pida a su farmacia que se ponga en contacto con nuestra oficina. Harland Dingwall de fax es [place] [phone].  Si tiene un asunto urgente cuando la clnica est cerrada  y que no puede esperar hasta el siguiente da hbil, puede llamar/localizar a su doctor(a) al nmero que aparece a continuacin.   Por favor, tenga en cuenta que aunque hacemos todo lo posible para estar disponibles para asuntos urgentes fuera del horario de [place], no estamos disponibles las 24 horas del da, los 7 das de la [place].   Si tiene un problema urgente y no puede comunicarse con nosotros, puede optar por buscar atencin mdica  en el consultorio de su doctor(a), en una clnica privada, en un centro de atencin urgente o en una sala de emergencias.  Si tiene Engineering geologist, por favor llame inmediatamente al 911 o vaya a la sala de emergencias.  Nmeros de bper  - Dr. Nehemiah Massed: [phone]  - Dra. Moye: [phone]  - Dra. Nicole Kindred: [phone]  En caso de  inclemencias del [place], por favor llame a Johnsie Kindred principal al [phone] para una actualizacin sobre el [place] de cualquier retraso o cierre.  Consejos para la medicacin en dermatologa: Por favor, guarde las cajas en las que vienen los medicamentos de uso tpico para ayudarle a seguir las instrucciones sobre dnde y cmo usarlos. Las farmacias generalmente imprimen las instrucciones del medicamento slo en las cajas y no directamente en los tubos del [place].   Si su medicamento es muy caro, por favor, pngase en contacto con Zigmund Daniel llamando al [phone] y presione la opcin 4 o envenos un mensaje a travs de Pharmacist, community.   No podemos decirle cul ser su copago por los medicamentos por adelantado ya que esto es diferente dependiendo de la cobertura de su seguro. Sin embargo, es posible que podamos encontrar un medicamento sustituto a Electrical engineer un formulario para que el seguro cubra el medicamento que se considera necesario.   Si se requiere una autorizacin previa para que su compaa de seguros [place] su medicamento, por favor permtanos de 1 a 2 das hbiles para completar este proceso.  Los precios de los medicamentos varan con frecuencia dependiendo del Environmental consultant de dnde se surte la receta y alguna farmacias pueden ofrecer precios ms baratos.  El sitio web www.goodrx.com tiene cupones para medicamentos de Airline pilot. Los precios aqu no tienen en cuenta lo que podra costar con la ayuda del seguro (puede ser ms barato con su seguro), pero el sitio web puede darle el precio si no utiliz Research scientist (physical sciences).  - Puede imprimir el cupn correspondiente y llevarlo con su receta a la farmacia.  - Tambin puede pasar por nuestra oficina durante el horario de atencin regular y Charity fundraiser una tarjeta de cupones de GoodRx.  - Si necesita que su receta se enve electrnicamente a una farmacia diferente, informe a nuestra oficina a travs de MyChart de [place] o  por telfono llamando al [phone] y presione la opcin 4.

## 2022-03-24 NOTE — Progress Notes (Signed)
Follow-Up Visit   Subjective  Barbara Murphy is a 81 y.o. female who presents for the following: Annual Exam (Hx MMIS, AK's ). The patient presents for Total-Body Skin Exam (TBSE) for skin cancer screening and mole check.  The patient has spots, moles and lesions to be evaluated, some may be new or changing and the patient has concerns that these could be cancer.   The following portions of the chart were reviewed this encounter and updated as appropriate:   Tobacco  Allergies  Meds  Problems  Med Hx  Surg Hx  Fam Hx      Review of Systems:  No other skin or systemic complaints except as noted in HPI or Assessment and Plan.  Objective  Well appearing patient in no apparent distress; mood and affect are within normal limits.  A full examination was performed including scalp, head, eyes, ears, nose, lips, neck, chest, axillae, abdomen, back, buttocks, bilateral upper extremities, bilateral lower extremities, hands, feet, fingers, toes, fingernails, and toenails. All findings within normal limits unless otherwise noted below.  L 5th finger x 1, L dorsal hand x 1, mid chest x 1, R elbow x 1 (4) Erythematous thin papules/macules with gritty scale.     Assessment & Plan  AK (actinic keratosis) (4) L 5th finger x 1, L dorsal hand x 1, mid chest x 1, R elbow x 1  Actinic keratoses are precancerous spots that appear secondary to cumulative UV radiation exposure/sun exposure over time. They are chronic with expected duration over 1 year. A portion of actinic keratoses will progress to squamous cell carcinoma of the skin. It is not possible to reliably predict which spots will progress to skin cancer and so treatment is recommended to prevent development of skin cancer.  Recommend daily broad spectrum sunscreen SPF 30+ to sun-exposed areas, reapply every 2 hours as needed.  Recommend staying in the shade or wearing long sleeves, sun glasses (UVA+UVB protection) and wide brim hats (4-inch  brim around the entire circumference of the hat). Call for new or changing lesions.   Destruction of lesion - L 5th finger x 1, L dorsal hand x 1, mid chest x 1, R elbow x 1 Complexity: simple   Destruction method: cryotherapy   Informed consent: discussed and consent obtained   Timeout:  patient name, date of birth, surgical site, and procedure verified Lesion destroyed using liquid nitrogen: Yes   Region frozen until ice ball extended beyond lesion: Yes   Outcome: patient tolerated procedure well with no complications   Post-procedure details: wound care instructions given   Additional details:  Treated L dorsal hand, L 5th finger today, R elbow  and mid chest with LN2 today   fluorouracil (EFUDEX) 5 % cream - L 5th finger x 1, L dorsal hand x 1, mid chest x 1, R elbow x 1 Apply to aa's R cheek/lower eyelid, R cheek, and L cheek BID x 1 week.   Lentigines - Scattered tan macules - Due to sun exposure - Benign-appearing, observe - Recommend daily broad spectrum sunscreen SPF 30+ to sun-exposed areas, reapply every 2 hours as needed. - Call for any changes  Seborrheic Keratoses - Stuck-on, waxy, tan-brown papules and/or plaques  - Benign-appearing - Discussed benign etiology and prognosis. - Observe - Call for any changes  Melanocytic Nevi - Tan-brown and/or pink-flesh-colored symmetric macules and papules - Benign appearing on exam today - Observation - Call clinic for new or changing moles - Recommend daily use  of broad spectrum spf 30+ sunscreen to sun-exposed areas.   Hemangiomas - Red papules - Discussed benign nature - Observe - Call for any changes  Actinic Damage - Severe, confluent actinic changes with pre-cancerous actinic keratoses  - Severe, chronic, not at goal, secondary to cumulative UV radiation exposure over time - diffuse scaly erythematous macules and papules with underlying dyspigmentation - Discussed Prescription "Field Treatment" for Severe,  Chronic Confluent Actinic Changes with Pre-Cancerous Actinic Keratoses Field treatment involves treatment of an entire area of skin that has confluent Actinic Changes (Sun/ Ultraviolet light damage) and PreCancerous Actinic Keratoses by method of PhotoDynamic Therapy (PDT) and/or prescription Topical Chemotherapy agents such as 5-fluorouracil, 5-fluorouracil/calcipotriene, and/or imiquimod.  The purpose is to decrease the number of clinically evident and subclinical PreCancerous lesions to prevent progression to development of skin cancer by chemically destroying early precancer changes that may or may not be visible.  It has been shown to reduce the risk of developing skin cancer in the treated area. As a result of treatment, redness, scaling, crusting, and open sores may occur during treatment course. One or more than one of these methods may be used and may have to be used several times to control, suppress and eliminate the PreCancerous changes. Discussed treatment course, expected reaction, and possible side effects. - Recommend daily broad spectrum sunscreen SPF 30+ to sun-exposed areas, reapply every 2 hours as needed.  - Staying in the shade or wearing long sleeves, sun glasses (UVA+UVB protection) and wide brim hats (4-inch brim around the entire circumference of the hat) are also recommended. - Call for new or changing lesions. - Start prescription 5-fluorouracil  BID x 7 days to the R cheek/lower eyelid x 3, R cheek, and L cheek  History of Melanoma in Situ - No evidence of recurrence today - Recommend regular full body skin exams - Recommend daily broad spectrum sunscreen SPF 30+ to sun-exposed areas, reapply every 2 hours as needed.  - Call if any new or changing lesions are noted between office visits  Skin cancer screening performed today.  Return in about 6 months (around 09/22/2022) for TBSE.  Luther Redo, CMA, am acting as scribe for Forest Gleason, MD .  Documentation: I have  reviewed the above documentation for accuracy and completeness, and I agree with the above.  Forest Gleason, MD

## 2022-04-06 ENCOUNTER — Encounter: Payer: Self-pay | Admitting: Dermatology

## 2022-05-27 ENCOUNTER — Encounter: Payer: Self-pay | Admitting: Internal Medicine

## 2022-05-27 ENCOUNTER — Ambulatory Visit (INDEPENDENT_AMBULATORY_CARE_PROVIDER_SITE_OTHER): Payer: Medicare PPO | Admitting: Internal Medicine

## 2022-05-27 VITALS — BP 142/80 | HR 56 | Temp 97.5°F | Ht 64.0 in | Wt 167.5 lb

## 2022-05-27 DIAGNOSIS — Z0000 Encounter for general adult medical examination without abnormal findings: Secondary | ICD-10-CM | POA: Diagnosis not present

## 2022-05-27 DIAGNOSIS — H353232 Exudative age-related macular degeneration, bilateral, with inactive choroidal neovascularization: Secondary | ICD-10-CM | POA: Insufficient documentation

## 2022-05-27 DIAGNOSIS — I10 Essential (primary) hypertension: Secondary | ICD-10-CM | POA: Diagnosis not present

## 2022-05-27 DIAGNOSIS — I358 Other nonrheumatic aortic valve disorders: Secondary | ICD-10-CM

## 2022-05-27 DIAGNOSIS — H353190 Nonexudative age-related macular degeneration, unspecified eye, stage unspecified: Secondary | ICD-10-CM | POA: Insufficient documentation

## 2022-05-27 DIAGNOSIS — H353130 Nonexudative age-related macular degeneration, bilateral, stage unspecified: Secondary | ICD-10-CM | POA: Diagnosis not present

## 2022-05-27 LAB — COMPREHENSIVE METABOLIC PANEL
ALT: 16 U/L (ref 0–35)
AST: 21 U/L (ref 0–37)
Albumin: 4.7 g/dL (ref 3.5–5.2)
Alkaline Phosphatase: 54 U/L (ref 39–117)
BUN: 13 mg/dL (ref 6–23)
CO2: 30 mEq/L (ref 19–32)
Calcium: 9.8 mg/dL (ref 8.4–10.5)
Chloride: 94 mEq/L — ABNORMAL LOW (ref 96–112)
Creatinine, Ser: 0.58 mg/dL (ref 0.40–1.20)
GFR: 83.73 mL/min (ref >60.00)
Glucose, Bld: 96 mg/dL (ref 70–99)
Potassium: 4.7 mEq/L (ref 3.5–5.1)
Sodium: 131 mEq/L — ABNORMAL LOW (ref 135–145)
Total Bilirubin: 1.1 mg/dL (ref 0.2–1.2)
Total Protein: 7.5 g/dL (ref 6.0–8.3)

## 2022-05-27 LAB — CBC
HCT: 40.4 % (ref 36.0–46.0)
Hemoglobin: 13.5 g/dL (ref 12.0–15.0)
MCHC: 33.4 g/dL (ref 30.0–36.0)
MCV: 86.7 fl (ref 78.0–100.0)
Platelets: 339 10*3/uL (ref 150.0–400.0)
RBC: 4.65 Mil/uL (ref 3.87–5.11)
RDW: 15.9 % — ABNORMAL HIGH (ref 11.5–15.5)
WBC: 6.3 10*3/uL (ref 4.0–10.5)

## 2022-05-27 NOTE — Progress Notes (Signed)
Subjective:    Patient ID: Barbara Murphy, female    DOB: 1940/09/12, 81 y.o.   MRN: [id number]  HPI Here for Medicare wellness visit and follow up of chronic health conditions Reviewed advanced directives Reviewed other doctors--Dr Moye-derm, Dr Rexene Alberts, Dr Quintella Reichert No hospitalizations or surgery Vision is stable--dry MD Hearing is good Exercising regularly Glass of wine with dinner daily No tobacco No falls No depression or anhedonia Independent with instrumental ADLs No sig memory issues  Taking heliocare and niacinamide tabs for melanoma prevention No skins problems  Has had some allergy symptoms Lots of clear rhinorrhea---thought it was a cold (but doesn't feel sick)  Continues on BP meds Has RN at [facility] check--- 138/78 in general No chest pain or SOB No syncope. Will have some mild dizziness---?when fatigued No edema No headaches  Current Outpatient Medications on File Prior to Visit  Medication Sig Dispense Refill   Alpha-Lipoic Acid 200 MG CAPS Take 1 capsule by mouth daily.     amLODipine (NORVASC) 5 MG tablet TAKE 1 TABLET EVERY DAY 90 tablet 3   b complex vitamins tablet Take 1 tablet by mouth daily.     Calcium Carbonate (CALCIUM 600 PO) Take 1 tablet by mouth daily.     Coenzyme Q10 200 MG capsule Take 200 mg by mouth daily.     fluorouracil (EFUDEX) 5 % cream Apply to aa's R cheek/lower eyelid, R cheek, and L cheek BID x 1 week. 15 g 0   losartan-hydrochlorothiazide (HYZAAR) 100-25 MG tablet Take 0.5 tablets by mouth daily. 1 tablet 0   Magnesium 200 MG TABS Take 1 tablet by mouth daily.     Multiple Vitamins-Minerals (PRESERVISION AREDS 2 PO) Take by mouth.     NIACINAMIDE PO Take 1 each by mouth daily.     Omega-3 Fatty Acids (FISH OIL) 1000 MG CAPS Take 1 capsule by mouth daily.     Polypodium Leucotomos (HELIOCARE PO) Take 1 capsule by mouth daily at 12 noon.     Probiotic Product (PROBIOTIC PO) Take 1 capsule by mouth daily.      Turmeric 500 MG CAPS Take by mouth.     Vitamin D, Cholecalciferol, 1000 units CAPS Take 2 capsules by mouth.     No current facility-administered medications on file prior to visit.    Allergies  Allergen Reactions   Sulfa Antibiotics Hives   Other    Metoprolol Other (See Comments)    Personality change, fatigue    Past Medical History:  Diagnosis Date   Actinic keratosis 40/97/3532   L shoulder, Lichenoid AK, needs LN2   Hypertension    Melanoma ([place])    Melanoma in situ ([place]) 07/2018   left arm    Past Surgical History:  Procedure Laterality Date   BREAST BIOPSY  1962 & 1980   benign   CARDIOVASCULAR STRESS TEST  01/2015   Nuclear---negative   DEXA  2005   normal   MELANOMA EXCISION     multiple--- [facility]   STRABISMUS SURGERY  1980   TONSILLECTOMY      Family History  Problem Relation Age of Onset   Cancer Mother        liver   Parkinson's disease Father    Diabetes Brother    Heart disease Brother    Stroke Sister    Heart disease Sister     Social History   Socioeconomic History   Marital status: Widowed    Spouse name: Not  on file   Number of children: 7   Years of education: Not on file   Highest education level: Not on file  Occupational History   Occupation: Pharmacist, hospital-- [employer]    Comment: Retired  Tobacco Use   Smoking status: Former    Packs/day: 0.25    Types: Cigarettes    Quit date: 07/25/1968    Years since quitting: 53.8   Smokeless tobacco: Never  Substance and Sexual Activity   Alcohol use: Not on file   Drug use: Not on file   Sexual activity: Not on file  Other Topics Concern   Not on file  Social History Narrative   Second marriage-- 41. Widowed 2020   2 children, 5 stepchildren   Multiple grandchildren and great grandchildren      Has living will   Son Gerald Stabs has health care POA   Would accept resuscitation attempts   Would leave tube feeds up to son   Social Determinants of Health    Financial Resource Strain: Not on file  Food Insecurity: Not on file  Transportation Needs: Not on file  Physical Activity: Not on file  Stress: Not on file  Social Connections: Not on file  Intimate Partner Violence: Not on file   Review of Systems Appetite is good Lost a few pounds--has cut out some of the junk Sleeps okay Wears seat belt Did just chip a tooth--but okay in general No heartburn or dysphagia Bowels are good (prebiotic helps) Voids fine--some incontinence in AM upon arising (pad) No sig back or joint pains--done with shoulder therapy No suspicious skin lesions today    Objective:   Physical Exam Constitutional:      Appearance: Normal appearance.  HENT:     Mouth/Throat:     Comments: No lesions Eyes:     Conjunctiva/sclera: Conjunctivae normal.     Pupils: Pupils are equal, round, and reactive to light.  Cardiovascular:     Rate and Rhythm: Normal rate and regular rhythm.     Pulses: Normal pulses.     Heart sounds:     No gallop.     Comments: Same coarse Gr 3/6 aortic systolic murmur Pulmonary:     Effort: Pulmonary effort is normal.     Breath sounds: Normal breath sounds. No wheezing or rales.  Abdominal:     Palpations: Abdomen is soft.     Tenderness: There is no abdominal tenderness.  Musculoskeletal:     Cervical back: Neck supple.     Right lower leg: No edema.     Left lower leg: No edema.  Lymphadenopathy:     Cervical: No cervical adenopathy.  Skin:    Findings: No lesion or rash.  Neurological:     General: No focal deficit present.     Mental Status: She is alert and oriented to person, place, and time.     Comments: Mini-cog normal  Psychiatric:        Mood and Affect: Mood normal.        Behavior: Behavior normal.            Assessment & Plan:

## 2022-05-27 NOTE — Assessment & Plan Note (Signed)
No action needed

## 2022-05-27 NOTE — Assessment & Plan Note (Signed)
I have personally reviewed the Medicare Annual Wellness questionnaire and have noted 1. The patient's medical and social history 2. Their use of alcohol, tobacco or illicit drugs 3. Their current medications and supplements 4. The patient's functional ability including ADL's, fall risks, home safety risks and hearing or visual             impairment. 5. Diet and physical activities 6. Evidence for depression or mood disorders  The patients weight, height, BMI and visual acuity have been recorded in the chart I have made referrals, counseling and provided education to the patient based review of the above and I have provided the pt with a written personalized care plan for preventive services.  I have provided you with a copy of your personalized plan for preventive services. Please take the time to review along with your updated medication list.  Done with cancer screening COVID vaccine next week Had flu vaccine Exercises regularly including strength training

## 2022-05-27 NOTE — Assessment & Plan Note (Signed)
Vision stable Taking AREDs daily

## 2022-05-27 NOTE — Assessment & Plan Note (Signed)
BP Readings from Last 3 Encounters:  05/27/22 (!) 142/80  09/06/21 102/68  05/31/21 116/78   Good control in general on amlodipine 5, losartan/HCTZ 100/25 Will check labs

## 2022-06-19 ENCOUNTER — Other Ambulatory Visit: Payer: Self-pay | Admitting: Internal Medicine

## 2022-08-09 DIAGNOSIS — H353132 Nonexudative age-related macular degeneration, bilateral, intermediate dry stage: Secondary | ICD-10-CM | POA: Diagnosis not present

## 2022-08-09 DIAGNOSIS — H35371 Puckering of macula, right eye: Secondary | ICD-10-CM | POA: Diagnosis not present

## 2022-08-09 DIAGNOSIS — H43822 Vitreomacular adhesion, left eye: Secondary | ICD-10-CM | POA: Diagnosis not present

## 2022-08-13 ENCOUNTER — Other Ambulatory Visit: Payer: Self-pay | Admitting: Internal Medicine

## 2022-09-05 IMAGING — DX DG SHOULDER 2+V*R*
3 series · 3 of 3 positions shown · non-contrast
Comparison: None.

CLINICAL DATA: Pain and impingement.

EXAM:
RIGHT SHOULDER - 2+ VIEW

[shoulder grashey]
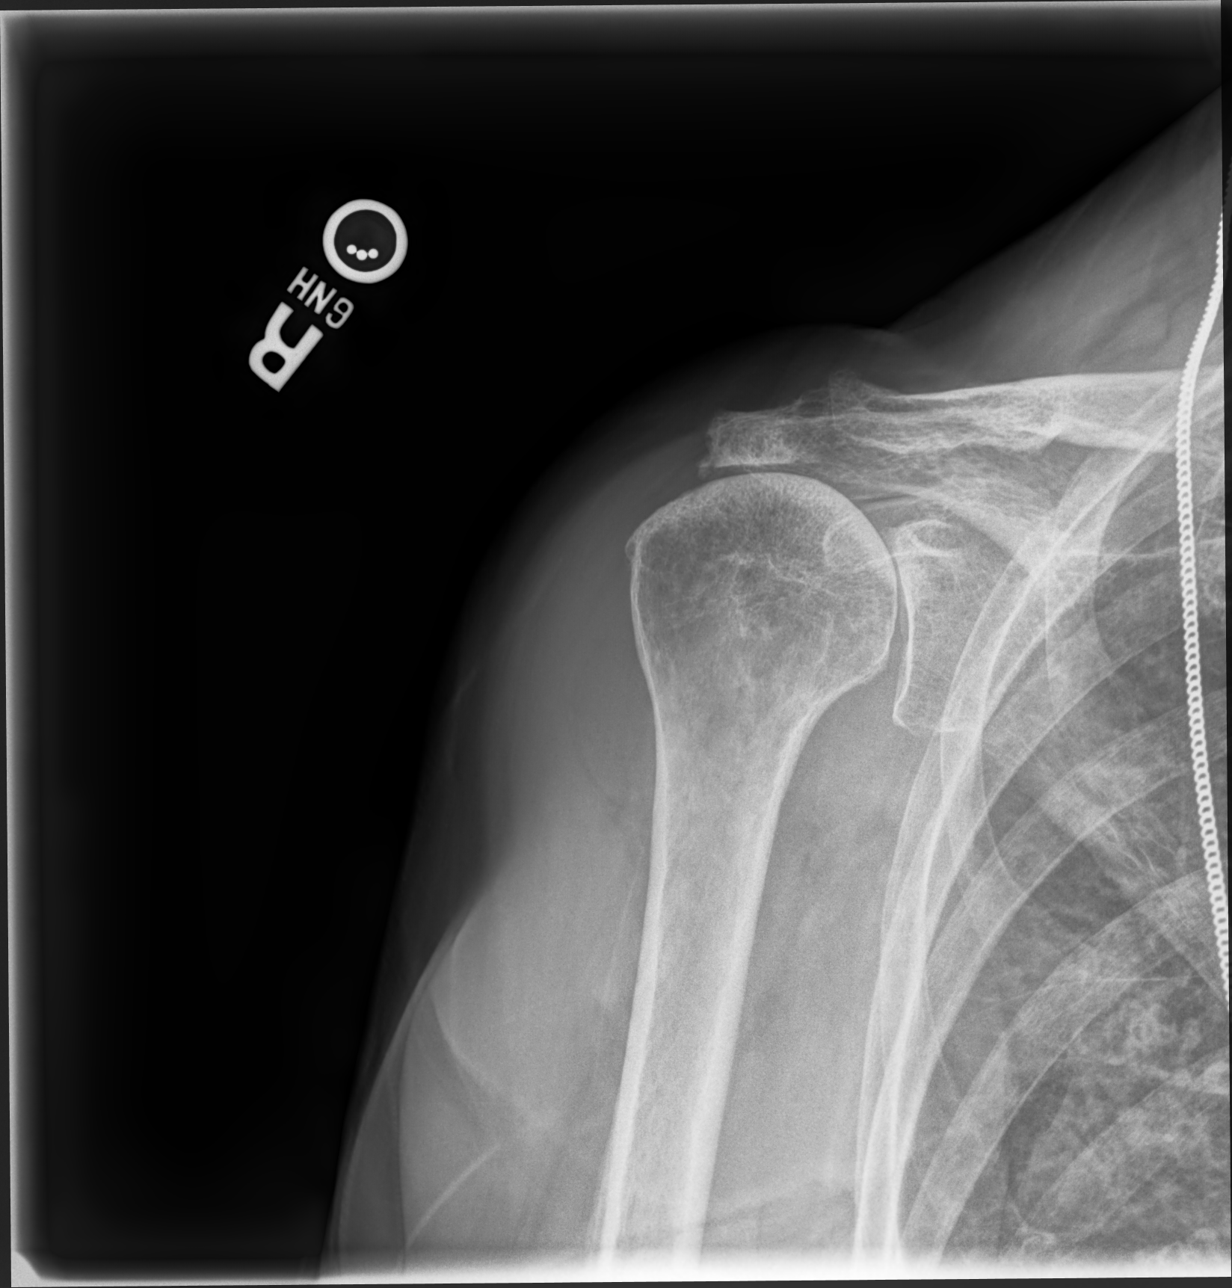

[shoulder (y view) lat]
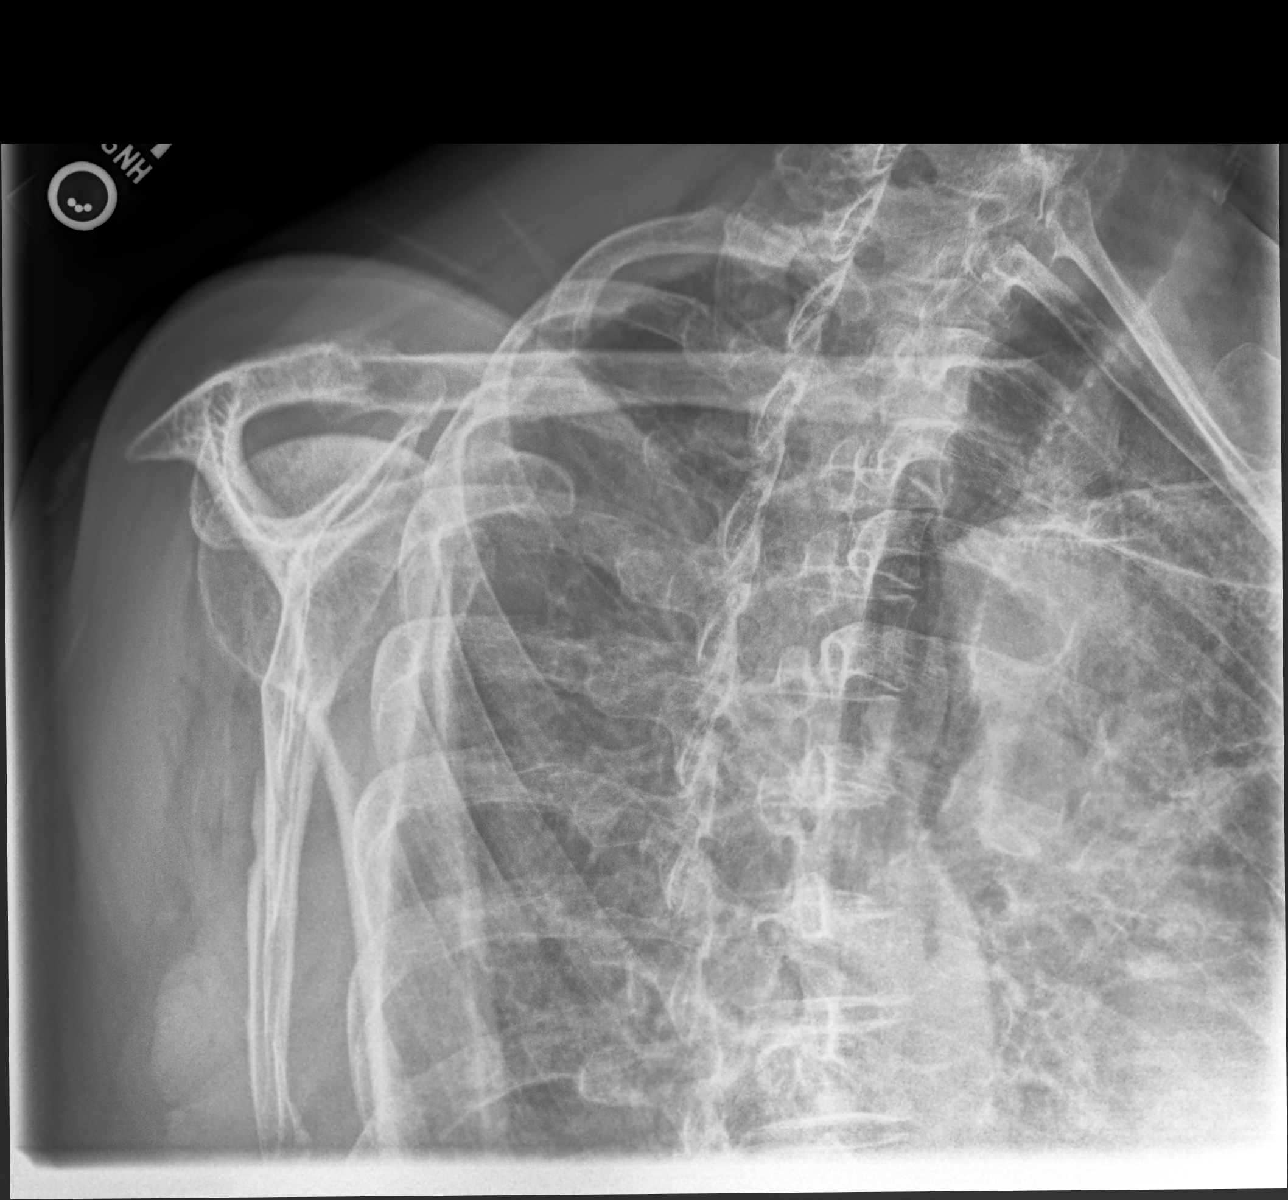

[shoulder axial]
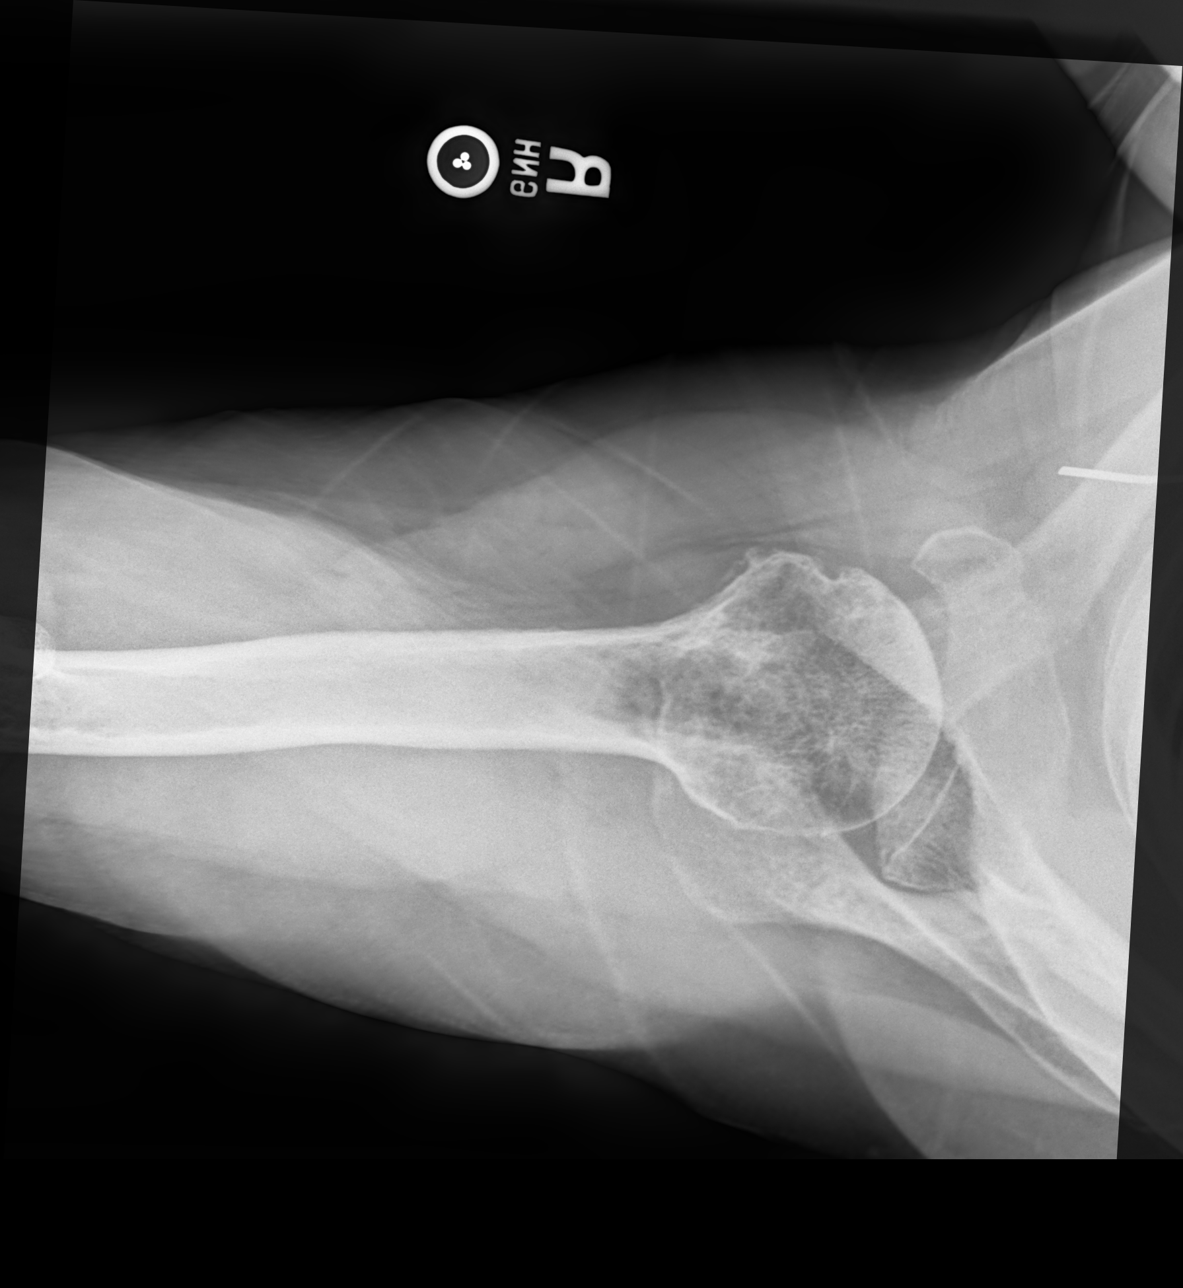

[3 of 3 positions shown; findings below may reference images not displayed]

FINDINGS: The humeral head is high-riding and nearly contacts the undersurface
of the acromion. Superior glenohumeral bone-on-bone contact with
degenerative subchondral sclerosis. Mild superolateral humeral head
degenerative osteophytosis. Mild cortical flattening of the superior
humeral head, likely from chronic abutment with the inferior surface
of the acromion where there is also subchondral sclerosis and mild
peripheral degenerative spurring. Severe acromioclavicular joint
space narrowing and peripheral osteophytosis. No acute fracture or
dislocation.
IMPRESSION: :
IMPRESSION: 1. Severe glenohumeral osteoarthritis.
2. Moderate acromioclavicular osteoarthritis.
3. High-riding humeral head suggesting a large full-thickness
superior rotator cuff tear.

## 2022-09-29 ENCOUNTER — Ambulatory Visit: Payer: Medicare PPO | Admitting: Dermatology

## 2022-09-29 ENCOUNTER — Encounter: Payer: Self-pay | Admitting: Dermatology

## 2022-09-29 VITALS — BP 173/95 | HR 80

## 2022-09-29 DIAGNOSIS — D229 Melanocytic nevi, unspecified: Secondary | ICD-10-CM

## 2022-09-29 DIAGNOSIS — Z1283 Encounter for screening for malignant neoplasm of skin: Secondary | ICD-10-CM

## 2022-09-29 DIAGNOSIS — L570 Actinic keratosis: Secondary | ICD-10-CM

## 2022-09-29 DIAGNOSIS — L821 Other seborrheic keratosis: Secondary | ICD-10-CM | POA: Diagnosis not present

## 2022-09-29 DIAGNOSIS — L814 Other melanin hyperpigmentation: Secondary | ICD-10-CM

## 2022-09-29 DIAGNOSIS — Z85820 Personal history of malignant melanoma of skin: Secondary | ICD-10-CM

## 2022-09-29 DIAGNOSIS — Z86006 Personal history of melanoma in-situ: Secondary | ICD-10-CM | POA: Diagnosis not present

## 2022-09-29 DIAGNOSIS — L578 Other skin changes due to chronic exposure to nonionizing radiation: Secondary | ICD-10-CM | POA: Diagnosis not present

## 2022-09-29 NOTE — Progress Notes (Signed)
Follow-Up Visit   Subjective  Barbara Murphy is a 82 y.o. female who presents for the following: Annual Exam (Hx of MIS, HxAK).  The patient presents for Total-Body Skin Exam (TBSE) for skin cancer screening and mole check.  The patient has spots, moles and lesions to be evaluated, some may be new or changing and the patient has concerns that these could be cancer.  The following portions of the chart were reviewed this encounter and updated as appropriate:  Tobacco  Allergies  Meds  Problems  Med Hx  Surg Hx  Fam Hx      Review of Systems: No other skin or systemic complaints except as noted in HPI or Assessment and Plan.   Objective  Well appearing patient in no apparent distress; mood and affect are within normal limits.  A full examination was performed including scalp, head, eyes, ears, nose, lips, neck, chest, axillae, abdomen, back, buttocks, bilateral upper extremities, bilateral lower extremities, hands, feet, fingers, toes, fingernails, and toenails. All findings within normal limits unless otherwise noted below.  Right Knee - Anterior (hypertrophic) x1, R-2 MCP joint x1, right upper arm x1, left lateral zygoma x1, right jaw (hypertrophic) x1, right cheek x1 (6) hyperkeratotic papules at right knee and right jaw. Erythematous thin papules/macules with gritty scale.    Assessment & Plan   History of Melanoma in Situ. Left arm. Excised 07/2018 - No evidence of recurrence today - No lymphadenopathy - Recommend regular full body skin exams - Recommend daily broad spectrum sunscreen SPF 30+ to sun-exposed areas, reapply every 2 hours as needed.  - Call if any new or changing lesions are noted between office visits   History of Melanoma - No evidence of recurrence today - No lymphadenopathy - Recommend regular full body skin exams - Recommend daily broad spectrum sunscreen SPF 30+ to sun-exposed areas, reapply every 2 hours as needed.  - Call if any new or changing  lesions are noted between office visits   Lentigines - Scattered tan macules - Due to sun exposure - Benign-appearing, observe - Recommend daily broad spectrum sunscreen SPF 30+ to sun-exposed areas, reapply every 2 hours as needed. - Call for any changes  Seborrheic Keratoses - Stuck-on, waxy, tan-brown papules and/or plaques  - Benign-appearing - Discussed benign etiology and prognosis. - Observe - Call for any changes  Melanocytic Nevi - Tan-brown and/or pink-flesh-colored symmetric macules and papules - Benign appearing on exam today - Observation - Call clinic for new or changing moles - Recommend daily use of broad spectrum spf 30+ sunscreen to sun-exposed areas.   Hemangiomas - Red papules - Discussed benign nature - Observe - Call for any changes  Actinic Damage - Chronic condition, secondary to cumulative UV/sun exposure - diffuse scaly erythematous macules with underlying dyspigmentation - Recommend daily broad spectrum sunscreen SPF 30+ to sun-exposed areas, reapply every 2 hours as needed.  - Staying in the shade or wearing long sleeves, sun glasses (UVA+UVB protection) and wide brim hats (4-inch brim around the entire circumference of the hat) are also recommended for sun protection.  - Call for new or changing lesions.  Skin cancer screening performed today.  AK (actinic keratosis) (6) Right Knee - Anterior (hypertrophic) x1, R-2 MCP joint x1, right upper arm x1, left lateral zygoma x1, right jaw (hypertrophic) x1, right cheek x1  Actinic keratoses are precancerous spots that appear secondary to cumulative UV radiation exposure/sun exposure over time. They are chronic with expected duration over 1 year. A  portion of actinic keratoses will progress to squamous cell carcinoma of the skin. It is not possible to reliably predict which spots will progress to skin cancer and so treatment is recommended to prevent development of skin cancer.  Recommend daily broad  spectrum sunscreen SPF 30+ to sun-exposed areas, reapply every 2 hours as needed.  Recommend staying in the shade or wearing long sleeves, sun glasses (UVA+UVB protection) and wide brim hats (4-inch brim around the entire circumference of the hat). Call for new or changing lesions.  Destruction of lesion - Right Knee - Anterior (hypertrophic) x1, R-2 MCP joint x1, right upper arm x1, left lateral zygoma x1, right jaw (hypertrophic) x1, right cheek x1  Destruction method: cryotherapy   Informed consent: discussed and consent obtained   Lesion destroyed using liquid nitrogen: Yes   Region frozen until ice ball extended beyond lesion: Yes   Outcome: patient tolerated procedure well with no complications   Post-procedure details: wound care instructions given   Additional details:  Prior to procedure, discussed risks of blister formation, small wound, skin dyspigmentation, or rare scar following cryotherapy. Recommend Vaseline ointment to treated areas while healing.    Return in about 6 months (around 04/01/2023) for TBSE, HxMIS, AK Follow Up in 2 months.  I, Emelia Salisbury, CMA, am acting as scribe for Forest Gleason, MD.  Documentation: I have reviewed the above documentation for accuracy and completeness, and I agree with the above.  Forest Gleason, MD

## 2022-09-29 NOTE — Patient Instructions (Addendum)
Cryotherapy Aftercare  Wash gently with soap and water everyday.   Apply Vaseline and Band-Aid daily until healed.    Recommend taking Heliocare sun protection supplement daily in sunny weather for additional sun protection. For maximum protection on the sunniest days, you can take up to 2 capsules of regular Heliocare OR take 1 capsule of Heliocare Ultra. For prolonged exposure (such as a full day in the sun), you can repeat your dose of the supplement 4 hours after your first dose. Heliocare can be purchased at [employer], at some [facility] or at [email or web address].     Recommend daily broad spectrum sunscreen SPF 30+ to sun-exposed areas, reapply every 2 hours as needed. Call for new or changing lesions.  Staying in the shade or wearing long sleeves, sun glasses (UVA+UVB protection) and wide brim hats (4-inch brim around the entire circumference of the hat) are also recommended for sun protection.    Seborrheic Keratosis  What causes seborrheic keratoses? Seborrheic keratoses are harmless, common skin growths that first appear during adult life.  As time goes by, more growths appear.  Some people may develop a large number of them.  Seborrheic keratoses appear on both covered and uncovered body parts.  They are not caused by sunlight.  The tendency to develop seborrheic keratoses can be inherited.  They vary in color from skin-colored to gray, brown, or even black.  They can be either smooth or have a rough, warty surface.   Seborrheic keratoses are superficial and look as if they were stuck on the skin.  Under the microscope this type of keratosis looks like layers upon layers of skin.  That is why at times the top layer may seem to fall off, but the rest of the growth remains and re-grows.    Treatment Seborrheic keratoses do not need to be treated, but can easily be removed in the office.  Seborrheic keratoses often cause symptoms when they rub on clothing or jewelry.  Lesions can  be in the way of shaving.  If they become inflamed, they can cause itching, soreness, or burning.  Removal of a seborrheic keratosis can be accomplished by freezing, burning, or surgery. If any spot bleeds, scabs, or grows rapidly, please return to have it checked, as these can be an indication of a skin cancer.     Melanoma ABCDEs  Melanoma is the most dangerous type of skin cancer, and is the leading cause of death from skin disease.  You are more likely to develop melanoma if you: Have light-colored skin, light-colored eyes, or red or blond hair Spend a lot of time in the sun Tan regularly, either outdoors or in a tanning bed Have had blistering sunburns, especially during childhood Have a close family member who has had a melanoma Have atypical moles or large birthmarks  Early detection of melanoma is key since treatment is typically straightforward and cure rates are extremely high if we catch it early.   The first sign of melanoma is often a change in a mole or a new dark spot.  The ABCDE system is a way of remembering the signs of melanoma.  A for asymmetry:  The two halves do not match. B for border:  The edges of the growth are irregular. C for color:  A mixture of colors are present instead of an even brown color. D for diameter:  Melanomas are usually (but not always) greater than 48m - the size of a pencil eraser. E for evolution:  The spot keeps changing in size, shape, and color.  Please check your skin once per month between visits. You can use a small mirror in front and a large mirror behind you to keep an eye on the back side or your body.   If you see any new or changing lesions before your next follow-up, please call to schedule a visit.  Please continue daily skin protection including broad spectrum sunscreen SPF 30+ to sun-exposed areas, reapplying every 2 hours as needed when you're outdoors.   Staying in the shade or wearing long sleeves, sun glasses (UVA+UVB  protection) and wide brim hats (4-inch brim around the entire circumference of the hat) are also recommended for sun protection.    Due to recent changes in healthcare laws, you may see results of your pathology and/or laboratory studies on MyChart before the doctors have had a chance to review them. We understand that in some cases there may be results that are confusing or concerning to you. Please understand that not all results are received at the same time and often the doctors may need to interpret multiple results in order to provide you with the best plan of care or course of treatment. Therefore, we ask that you please give [place] 2 business days to thoroughly review all your results before contacting the office for clarification. Should we see a critical lab result, you will be contacted sooner.   If You Need Anything After Your Visit  If you have any questions or concerns for your doctor, please call our main line at [phone] and press option 4 to reach your doctor's medical assistant. If no one answers, please leave a voicemail as directed and we will return your call as soon as possible. Messages left after 4 pm will be answered the following business day.   You may also send [place] a message via [place]. We typically respond to MyChart messages within 1-2 business days.  For prescription refills, please ask your pharmacy to contact our office. Our fax number is [phone].  If you have an urgent issue when the clinic is closed that cannot wait until the next business day, you can page your doctor at the number below.    Please note that while we do our best to be available for urgent issues outside of office hours, we are not available 24/7.   If you have an urgent issue and are unable to reach [place], you may choose to seek medical care at your doctor's office, retail clinic, urgent care center, or emergency room.  If you have a medical emergency, please immediately call 911 or go to the  emergency department.  Pager Numbers  - Dr. Nehemiah Massed: [phone]  - Dr. Laurence Ferrari: [phone]  - Dr. Nicole Kindred: [phone]  In the event of inclement weather, please call our main line at [phone] for an update on the status of any delays or closures.  Dermatology Medication Tips: Please keep the boxes that topical medications come in in order to help keep track of the instructions about where and how to use these. Pharmacies typically print the medication instructions only on the boxes and not directly on the medication tubes.   If your medication is too expensive, please contact our office at [phone] option 4 or send [place] a message through [place].   We are unable to tell what your co-pay for medications will be in advance as this is different depending on your insurance coverage. However, we may be able to find  a substitute medication at lower cost or fill out paperwork to get insurance to cover a needed medication.   If a prior authorization is required to get your medication covered by your insurance company, please allow [place] 1-2 business days to complete this process.  Drug prices often vary depending on where the prescription is filled and some pharmacies may offer cheaper prices.  The website www.goodrx.com contains coupons for medications through different pharmacies. The prices here do not account for what the cost may be with help from insurance (it may be cheaper with your insurance), but the website can give you the price if you did not use any insurance.  - You can print the associated coupon and take it with your prescription to the pharmacy.  - You may also stop by our office during regular business hours and pick up a GoodRx coupon card.  - If you need your prescription sent electronically to a different pharmacy, notify our office through [facility] or by phone at [phone] option 4.     Si Usted Necesita Algo Despus de Su Visita  Tambin puede  enviarnos un mensaje a travs de Pharmacist, community. Por lo general respondemos a los mensajes de MyChart en el transcurso de 1 a 2 das hbiles.  Para renovar recetas, por favor pida a su farmacia que se ponga en contacto con nuestra oficina. Harland Dingwall de fax es [place] [phone].  Si tiene un asunto urgente cuando la clnica est cerrada y que no puede esperar hasta el siguiente da hbil, puede llamar/localizar a su doctor(a) al nmero que aparece a continuacin.   Por favor, tenga en cuenta que aunque hacemos todo lo posible para estar disponibles para asuntos urgentes fuera del horario de [place], no estamos disponibles las 24 horas del da, los 7 das de la [place].   Si tiene un problema urgente y no puede comunicarse con nosotros, puede optar por buscar atencin mdica  en el consultorio de su doctor(a), en una clnica privada, en un centro de atencin urgente o en una sala de emergencias.  Si tiene Engineering geologist, por favor llame inmediatamente al 911 o vaya a la sala de emergencias.  Nmeros de bper  - Dr. Nehemiah Massed: [phone]  - Dra. Moye: [phone]  - Dra. Nicole Kindred: [phone]  En caso de inclemencias del [place], por favor llame a Johnsie Kindred principal al [phone] para una actualizacin sobre el [place] de cualquier retraso o cierre.  Consejos para la medicacin en dermatologa: Por favor, guarde las cajas en las que vienen los medicamentos de uso tpico para ayudarle a seguir las instrucciones sobre dnde y cmo usarlos. Las farmacias generalmente imprimen las instrucciones del medicamento slo en las cajas y no directamente en los tubos del [place].   Si su medicamento es muy caro, por favor, pngase en contacto con Zigmund Daniel llamando al [phone] y presione la opcin 4 o envenos un mensaje a travs de Pharmacist, community.   No podemos decirle cul ser su copago por los medicamentos por adelantado ya que esto es diferente dependiendo de la cobertura de su seguro.  Sin embargo, es posible que podamos encontrar un medicamento sustituto a Electrical engineer un formulario para que el seguro cubra el medicamento que se considera necesario.   Si se requiere una autorizacin previa para que su compaa de seguros [place] su medicamento, por favor permtanos de 1 a 2 das hbiles para completar este proceso.  Los precios de los medicamentos varan con frecuencia dependiendo del  lugar de dnde se surte la receta y alguna farmacias pueden ofrecer precios ms baratos.  El sitio web www.goodrx.com tiene cupones para medicamentos de Airline pilot. Los precios aqu no tienen en cuenta lo que podra costar con la ayuda del seguro (puede ser ms barato con su seguro), pero el sitio web puede darle el precio si no utiliz Research scientist (physical sciences).  - Puede imprimir el cupn correspondiente y llevarlo con su receta a la farmacia.  - Tambin puede pasar por nuestra oficina durante el horario de atencin regular y Charity fundraiser una tarjeta de cupones de GoodRx.  - Si necesita que su receta se enve electrnicamente a una farmacia diferente, informe a nuestra oficina a travs de MyChart de [place] o por telfono llamando al [phone] y presione la opcin 4.

## 2022-10-02 ENCOUNTER — Encounter: Payer: Self-pay | Admitting: Dermatology

## 2022-10-10 ENCOUNTER — Telehealth: Payer: Self-pay | Admitting: Internal Medicine

## 2022-10-10 NOTE — Telephone Encounter (Signed)
Supposed to get it April 9th. Had Covid a few weeks ago. She will ask the nurse at [facility] since multiple people had covid at the same time. Will see what they have been told about getting it or waiting.

## 2022-10-10 NOTE — Telephone Encounter (Signed)
Tried to call pt back. No answer. No VM to leave a message.

## 2022-10-10 NOTE — Telephone Encounter (Signed)
Patient called in and would like advice as to if she should receive the covid booster at [facility]? Please advise.

## 2022-11-30 ENCOUNTER — Ambulatory Visit (INDEPENDENT_AMBULATORY_CARE_PROVIDER_SITE_OTHER): Payer: Medicare PPO | Admitting: Dermatology

## 2022-11-30 DIAGNOSIS — L309 Dermatitis, unspecified: Secondary | ICD-10-CM | POA: Diagnosis not present

## 2022-11-30 DIAGNOSIS — D692 Other nonthrombocytopenic purpura: Secondary | ICD-10-CM | POA: Diagnosis not present

## 2022-11-30 DIAGNOSIS — Z872 Personal history of diseases of the skin and subcutaneous tissue: Secondary | ICD-10-CM

## 2022-11-30 DIAGNOSIS — L570 Actinic keratosis: Secondary | ICD-10-CM

## 2022-11-30 NOTE — Patient Instructions (Addendum)
Cryotherapy Aftercare  Wash gently with soap and water everyday.   Apply Vaseline and Band-Aid daily until healed.     Due to recent changes in healthcare laws, you may see results of your pathology and/or laboratory studies on MyChart before the doctors have had a chance to review them. We understand that in some cases there may be results that are confusing or concerning to you. Please understand that not all results are received at the same time and often the doctors may need to interpret multiple results in order to provide you with the best plan of care or course of treatment. Therefore, we ask that you please give us 2 business days to thoroughly review all your results before contacting the office for clarification. Should we see a critical lab result, you will be contacted sooner.   If You Need Anything After Your Visit  If you have any questions or concerns for your doctor, please call our main line at [phone] and press option 4 to reach your doctor's medical assistant. If no one answers, please leave a voicemail as directed and we will return your call as soon as possible. Messages left after 4 pm will be answered the following business day.   You may also send us a message via MyChart. We typically respond to MyChart messages within 1-2 business days.  For prescription refills, please ask your pharmacy to contact our office. Our fax number is [phone].  If you have an urgent issue when the clinic is closed that cannot wait until the next business day, you can page your doctor at the number below.    Please note that while we do our best to be available for urgent issues outside of office hours, we are not available 24/7.   If you have an urgent issue and are unable to reach us, you may choose to seek medical care at your doctor's office, retail clinic, urgent care center, or emergency room.  If you have a medical emergency, please immediately call 911 or go to the  emergency department.  Pager Numbers  - Dr. Kowalski: [phone]  - Dr. Moye: [phone]  - Dr. Stewart: [phone]  In the event of inclement weather, please call our main line at [phone] for an update on the status of any delays or closures.  Dermatology Medication Tips: Please keep the boxes that topical medications come in in order to help keep track of the instructions about where and how to use these. Pharmacies typically print the medication instructions only on the boxes and not directly on the medication tubes.   If your medication is too expensive, please contact our office at [phone] option 4 or send us a message through MyChart.   We are unable to tell what your co-pay for medications will be in advance as this is different depending on your insurance coverage. However, we may be able to find a substitute medication at lower cost or fill out paperwork to get insurance to cover a needed medication.   If a prior authorization is required to get your medication covered by your insurance company, please allow us 1-2 business days to complete this process.  Drug prices often vary depending on where the prescription is filled and some pharmacies may offer cheaper prices.  The website www.goodrx.com contains coupons for medications through different pharmacies. The prices here do not account for what the cost may be with help from insurance (it may be cheaper with your insurance), but the website can   you the price if you did not use any insurance.  - You can print the associated coupon and take it with your prescription to the pharmacy.  - You may also stop by our office during regular business hours and pick up a GoodRx coupon card.  - If you need your prescription sent electronically to a different pharmacy, notify our office through [facility] or by phone at [phone] option 4.

## 2022-11-30 NOTE — Progress Notes (Signed)
Follow-Up Visit   Subjective  Barbara Murphy is a 82 y.o. female who presents for the following: 2 month AK follow up. Areas treated with LN2 last visit, patient advises she did fine with no problems.   She does have a rash at right fingers, very rough skin, started about a month ago. She's been using Desitin but that did not help. Also with a few scaly spots she would like to point out.    The following portions of the chart were reviewed this encounter and updated as appropriate: medications, allergies, medical history  Review of Systems:  No other skin or systemic complaints except as noted in HPI or Assessment and Plan.  Objective  Well appearing patient in no apparent distress; mood and affect are within normal limits.   A focused examination was performed of the following areas: Legs, arms, fave  Relevant exam findings are noted in the Assessment and Plan.  R pretibia x 1, R elbow x 2, R jaw x 2 (5) Erythematous thin papules/macules with gritty scale.   Right Hand Scaly pink plaques at right hand    Assessment & Plan   HISTORY OF PRECANCEROUS ACTINIC KERATOSIS - site(s) of PreCancerous Actinic Keratosis clear today. - these may recur and new lesions may form requiring treatment to prevent transformation into skin cancer - observe for new or changing spots and contact [facility] for appointment if occur - photoprotection with sun protective clothing; sunglasses and broad spectrum sunscreen with SPF of at least 30 + and frequent self skin exams recommended - yearly exams by a dermatologist recommended for persons with history of PreCancerous Actinic Keratoses  Purpura - Chronic; persistent and recurrent.  Treatable, but not curable. - Violaceous macules and patches - Benign - Related to trauma, age, sun damage and/or use of blood thinners, chronic use of topical and/or oral steroids - Observe - Can use OTC arnica containing moisturizer such as Dermend Bruise  Formula if desired - Call for worsening or other concerns   AK (actinic keratosis) (5) R pretibia x 1, R elbow x 2, R jaw x 2  Actinic keratoses are precancerous spots that appear secondary to cumulative UV radiation exposure/sun exposure over time. They are chronic with expected duration over 1 year. A portion of actinic keratoses will progress to squamous cell carcinoma of the skin. It is not possible to reliably predict which spots will progress to skin cancer and so treatment is recommended to prevent development of skin cancer.  Recommend daily broad spectrum sunscreen SPF 30+ to sun-exposed areas, reapply every 2 hours as needed.  Recommend staying in the shade or wearing long sleeves, sun glasses (UVA+UVB protection) and wide brim hats (4-inch brim around the entire circumference of the hat). Call for new or changing lesions.  Prior to procedure, discussed risks of blister formation, small wound, skin dyspigmentation, or rare scar following cryotherapy. Recommend Vaseline ointment to treated areas while healing.   Destruction of lesion - R pretibia x 1, R elbow x 2, R jaw x 2  Destruction method: cryotherapy   Informed consent: discussed and consent obtained   Lesion destroyed using liquid nitrogen: Yes   Cryotherapy cycles:  2 Outcome: patient tolerated procedure well with no complications   Post-procedure details: wound care instructions given    Hand dermatitis Right Hand  Start Wynzora once daily for up to 1 month as needed.  Samples given x 2 Lot # [id number]  Exp: 09/2022  Topical steroids (such as triamcinolone,  fluocinolone, fluocinonide, mometasone, clobetasol, halobetasol, betamethasone, hydrocortisone) can cause thinning and lightening of the skin if they are used for too long in the same area. Your physician has selected the right strength medicine for your problem and area affected on the body. Please use your medication only as directed by your physician to prevent side  effects.   Gentle skin care reviewed    Return for TBSE, as scheduled.  Anise Salvo, RMA, am acting as scribe for Darden Dates, MD .   Documentation: I have reviewed the above documentation for accuracy and completeness, and I agree with the above.  Darden Dates, MD

## 2022-12-13 ENCOUNTER — Encounter: Payer: Self-pay | Admitting: Internal Medicine

## 2022-12-13 ENCOUNTER — Ambulatory Visit (INDEPENDENT_AMBULATORY_CARE_PROVIDER_SITE_OTHER): Payer: Medicare PPO | Admitting: Internal Medicine

## 2022-12-13 VITALS — BP 132/84 | HR 65 | Temp 97.6°F | Ht 64.0 in | Wt 172.0 lb

## 2022-12-13 DIAGNOSIS — H6592 Unspecified nonsuppurative otitis media, left ear: Secondary | ICD-10-CM | POA: Diagnosis not present

## 2022-12-13 MED ORDER — AMOXICILLIN 500 MG PO TABS: 1000.0000 mg | ORAL_TABLET | Freq: Two times a day (BID) | ORAL | 0 refills | Status: AC

## 2022-12-13 NOTE — Progress Notes (Signed)
Subjective:    Patient ID: Barbara Murphy, female    DOB: 07-28-1940, 82 y.o.   MRN: [id number]  HPI Here due to left ear pain  Started with ear pain intermittently since 4 days ago Did try cerumen solution and flushed it 3 days ago---might have helped some Went to NP at [facility] yesterday----noted inflammation  Hearing slight off No tinnitus  Current Outpatient Medications on File Prior to Visit  Medication Sig Dispense Refill   Alpha-Lipoic Acid 200 MG CAPS Take 1 capsule by mouth daily.     amLODipine (NORVASC) 5 MG tablet TAKE 1 TABLET EVERY DAY 90 tablet 3   b complex vitamins tablet Take 1 tablet by mouth daily.     Coenzyme Q10 200 MG capsule Take 200 mg by mouth daily.     losartan-hydrochlorothiazide (HYZAAR) 100-25 MG tablet TAKE 1 TABLET EVERY DAY 90 tablet 3   Magnesium 200 MG TABS Take 1 tablet by mouth daily.     Multiple Vitamins-Minerals (PRESERVISION AREDS 2 PO) Take by mouth.     NIACINAMIDE PO Take 1 each by mouth daily.     Omega-3 Fatty Acids (FISH OIL) 1000 MG CAPS Take 1 capsule by mouth daily.     Polypodium Leucotomos (HELIOCARE PO) Take 1 capsule by mouth daily at 12 noon.     Probiotic Product (PROBIOTIC PO) Take 1 capsule by mouth daily.     Turmeric 500 MG CAPS Take by mouth.     Vitamin D, Cholecalciferol, 1000 units CAPS Take 2 capsules by mouth.     No current facility-administered medications on file prior to visit.    Allergies  Allergen Reactions   Sulfa Antibiotics Hives   Other    Metoprolol Other (See Comments)    Personality change, fatigue    Past Medical History:  Diagnosis Date   Actinic keratosis 04/14/2021   L shoulder, Lichenoid AK, needs LN2   Hypertension    Melanoma (HCC)    Melanoma in situ (HCC) 07/2018   left arm    Past Surgical History:  Procedure Laterality Date   BREAST BIOPSY  1962 & 1980   benign   CARDIOVASCULAR STRESS TEST  01/2015   Nuclear---negative   DEXA  2005   normal   MELANOMA EXCISION      multiple--- [facility]   STRABISMUS SURGERY  1980   TONSILLECTOMY      Family History  Problem Relation Age of Onset   Cancer Mother        liver   Parkinson's disease Father    Diabetes Brother    Heart disease Brother    Stroke Sister    Heart disease Sister     Social History   Socioeconomic History   Marital status: Widowed    Spouse name: Not on file   Number of children: 7   Years of education: Not on file   Highest education level: Not on file  Occupational History   Occupation: Runner, broadcasting/film/video-- [employer]    Comment: Retired  Tobacco Use   Smoking status: Former    Packs/day: .25    Types: Cigarettes    Quit date: 07/25/1968    Years since quitting: 54.4   Smokeless tobacco: Never  Substance and Sexual Activity   Alcohol use: Not on file   Drug use: Not on file   Sexual activity: Not on file  Other Topics Concern   Not on file  Social History Narrative   Second marriage--  66. Widowed 2020   2 children, 5 stepchildren   Multiple grandchildren and great grandchildren      Has living will   Son Thayer Ohm has health care POA   Would accept resuscitation attempts   Would leave tube feeds up to son   Social Determinants of Health   Financial Resource Strain: Not on file  Food Insecurity: Not on file  Transportation Needs: Not on file  Physical Activity: Not on file  Stress: Not on file  Social Connections: Not on file  Intimate Partner Violence: Not on file   Review of Systems No recent travel No fever No respiratory symptoms     Objective:   Physical Exam Constitutional:      Appearance: Normal appearance.  HENT:     Right Ear: Tympanic membrane and ear canal normal.     Left Ear: Ear canal normal.     Ears:     Comments: Left TM is milky white and retracted Mild superior redness Neurological:     Mental Status: She is alert.            Assessment & Plan:

## 2022-12-13 NOTE — Assessment & Plan Note (Signed)
Unusual as first occurrence Will treat with amoxil 1000 bid x 7 days Consider change to doxy or augmentin if symptoms persist Consider ENT evaluation

## 2023-03-03 DIAGNOSIS — H52213 Irregular astigmatism, bilateral: Secondary | ICD-10-CM | POA: Diagnosis not present

## 2023-03-03 DIAGNOSIS — H353222 Exudative age-related macular degeneration, left eye, with inactive choroidal neovascularization: Secondary | ICD-10-CM | POA: Diagnosis not present

## 2023-03-03 DIAGNOSIS — H353132 Nonexudative age-related macular degeneration, bilateral, intermediate dry stage: Secondary | ICD-10-CM | POA: Diagnosis not present

## 2023-03-03 DIAGNOSIS — H353113 Nonexudative age-related macular degeneration, right eye, advanced atrophic without subfoveal involvement: Secondary | ICD-10-CM | POA: Diagnosis not present

## 2023-03-03 DIAGNOSIS — H35372 Puckering of macula, left eye: Secondary | ICD-10-CM | POA: Diagnosis not present

## 2023-03-03 DIAGNOSIS — H43822 Vitreomacular adhesion, left eye: Secondary | ICD-10-CM | POA: Diagnosis not present

## 2023-03-03 DIAGNOSIS — Z9841 Cataract extraction status, right eye: Secondary | ICD-10-CM | POA: Diagnosis not present

## 2023-03-03 DIAGNOSIS — Z9842 Cataract extraction status, left eye: Secondary | ICD-10-CM | POA: Diagnosis not present

## 2023-03-03 DIAGNOSIS — H34231 Retinal artery branch occlusion, right eye: Secondary | ICD-10-CM | POA: Diagnosis not present

## 2023-03-03 DIAGNOSIS — H43813 Vitreous degeneration, bilateral: Secondary | ICD-10-CM | POA: Diagnosis not present

## 2023-03-03 DIAGNOSIS — H53043 Amblyopia suspect, bilateral: Secondary | ICD-10-CM | POA: Diagnosis not present

## 2023-03-15 ENCOUNTER — Encounter: Payer: Self-pay | Admitting: Internal Medicine

## 2023-03-15 ENCOUNTER — Ambulatory Visit (INDEPENDENT_AMBULATORY_CARE_PROVIDER_SITE_OTHER): Payer: Medicare PPO | Admitting: Internal Medicine

## 2023-03-15 VITALS — BP 138/78 | HR 67 | Temp 97.9°F | Ht 64.0 in | Wt 169.0 lb

## 2023-03-15 DIAGNOSIS — H34232 Retinal artery branch occlusion, left eye: Secondary | ICD-10-CM | POA: Insufficient documentation

## 2023-03-15 NOTE — Assessment & Plan Note (Signed)
Reviewed etiology etc--highly suggestive of carotid disease Will check carotid ultrasounds---if high grade stenosis on left--will refer to vascular surgery Ginette Otto) Should consider statin--but will await the results

## 2023-03-15 NOTE — Progress Notes (Signed)
Subjective:    Patient ID: Barbara Murphy, female    DOB: 10-23-1940, 82 y.o.   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     amLODipine (NORVASC) 5 MG tablet TAKE 1 TABLET EVERY DAY 90 tablet 3   b complex vitamins tablet Take 1 tablet by mouth daily.     Coenzyme Q10 200 MG capsule Take 200 mg by mouth daily.     losartan-hydrochlorothiazide (HYZAAR) 100-25 MG tablet TAKE 1 TABLET EVERY DAY 90 tablet 3   Magnesium 200 MG TABS Take 1 tablet by mouth daily.     Multiple Vitamins-Minerals (PRESERVISION AREDS 2 PO) Take by mouth.     NIACINAMIDE PO Take 1 each by mouth daily.     Omega-3 Fatty Acids (FISH OIL) 1000 MG CAPS Take 1 capsule by mouth daily.     Polypodium Leucotomos (HELIOCARE PO) Take 1 capsule by mouth daily at 12 noon.     Probiotic Product (PROBIOTIC PO) Take 1 capsule by mouth daily.     Turmeric 500 MG CAPS Take by mouth.     Vitamin D, Cholecalciferol, 1000 units CAPS Take 2 capsules by mouth.     No current facility-administered medications on file prior to visit.    Allergies  Allergen Reactions   Sulfa Antibiotics Hives   Other    Metoprolol Other (See Comments)    Personality change, fatigue    Past Medical History:  Diagnosis Date   Actinic keratosis 04/14/2021   L shoulder, Lichenoid AK, needs LN2   Hypertension    Melanoma (HCC)    Melanoma in situ (HCC) 07/2018   left arm     Past Surgical History:  Procedure Laterality Date   BREAST BIOPSY  1962 & 1980   benign   CARDIOVASCULAR STRESS TEST  01/2015   Nuclear---negative   DEXA  2005   normal   MELANOMA EXCISION     multiple--- [facility]   STRABISMUS SURGERY  1980   TONSILLECTOMY      Family History  Problem Relation Age of Onset   Cancer Mother        liver   Parkinson's disease Father    Diabetes Brother    Heart disease Brother    Stroke Sister    Heart disease Sister     Social History   Socioeconomic History   Marital status: Widowed    Spouse name: Not on file   Number of children: 7   Years of education: Not on file   Highest education level: Not on file  Occupational History   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 Widowed 2020   2 children, 5 stepchildren   Multiple grandchildren and great grandchildren      Has living will   Son Thayer Ohm has health care POA   Would accept resuscitation attempts   Would leave tube feeds up to son   Social Determinants of Health   Financial Resource Strain: Not on file  Food Insecurity: Not on file  Transportation Needs: Not on file  Physical Activity: Not on file  Stress: Not on file  Social Connections: Not on file  Intimate Partner Violence: Not on file   Review of Systems Mild balance problems---does well with walking stick Sleeps well--good 6 hours and is refreshed Has cut out junk food--lost 3#    Objective:   Physical Exam Constitutional:      Appearance: Normal appearance.  Neck:      Comments: No carotid bruits Neurological:     Mental Status: She is alert.            Assessment & Plan:

## 2023-03-20 ENCOUNTER — Ambulatory Visit: Payer: Medicare PPO

## 2023-03-20 DIAGNOSIS — H34232 Retinal artery branch occlusion, left eye: Secondary | ICD-10-CM | POA: Diagnosis not present

## 2023-04-05 ENCOUNTER — Encounter: Payer: Medicare PPO | Admitting: Dermatology

## 2023-04-06 ENCOUNTER — Encounter: Payer: Medicare PPO | Admitting: Dermatology

## 2023-05-31 ENCOUNTER — Encounter: Payer: Self-pay | Admitting: Internal Medicine

## 2023-05-31 ENCOUNTER — Ambulatory Visit: Payer: Medicare PPO | Admitting: Internal Medicine

## 2023-05-31 VITALS — BP 138/70 | HR 76 | Temp 98.7°F | Ht 63.75 in | Wt 165.0 lb

## 2023-05-31 DIAGNOSIS — K52831 Collagenous colitis: Secondary | ICD-10-CM | POA: Diagnosis not present

## 2023-05-31 DIAGNOSIS — I358 Other nonrheumatic aortic valve disorders: Secondary | ICD-10-CM | POA: Diagnosis not present

## 2023-05-31 DIAGNOSIS — H34232 Retinal artery branch occlusion, left eye: Secondary | ICD-10-CM | POA: Diagnosis not present

## 2023-05-31 DIAGNOSIS — Z0000 Encounter for general adult medical examination without abnormal findings: Secondary | ICD-10-CM

## 2023-05-31 DIAGNOSIS — I10 Essential (primary) hypertension: Secondary | ICD-10-CM | POA: Diagnosis not present

## 2023-05-31 LAB — COMPREHENSIVE METABOLIC PANEL
ALT: 20 U/L (ref 0–35)
AST: 24 U/L (ref 0–37)
Albumin: 4.3 g/dL (ref 3.5–5.2)
Alkaline Phosphatase: 44 U/L (ref 39–117)
BUN: 14 mg/dL (ref 6–23)
CO2: 29 meq/L (ref 19–32)
Calcium: 9.2 mg/dL (ref 8.4–10.5)
Chloride: 90 meq/L — ABNORMAL LOW (ref 96–112)
Creatinine, Ser: 0.6 mg/dL (ref 0.40–1.20)
GFR: 82.46 mL/min (ref >60.00)
Glucose, Bld: 109 mg/dL — ABNORMAL HIGH (ref 70–99)
Potassium: 3.9 meq/L (ref 3.5–5.1)
Sodium: 125 meq/L — ABNORMAL LOW (ref 135–145)
Total Bilirubin: 1.1 mg/dL (ref 0.2–1.2)
Total Protein: 7.5 g/dL (ref 6.0–8.3)

## 2023-05-31 LAB — CBC
HCT: 38.3 % (ref 36.0–46.0)
Hemoglobin: 12.5 g/dL (ref 12.0–15.0)
MCHC: 32.6 g/dL (ref 30.0–36.0)
MCV: 86.7 fL (ref 78.0–100.0)
Platelets: 377 10*3/uL (ref 150.0–400.0)
RBC: 4.41 Mil/uL (ref 3.87–5.11)
RDW: 15.8 % — ABNORMAL HIGH (ref 11.5–15.5)
WBC: 4.7 10*3/uL (ref 4.0–10.5)

## 2023-05-31 NOTE — Progress Notes (Signed)
 Hearing Screening - Comments:: Passed whisper test Vision Screening - Comments:: August 2024

## 2023-05-31 NOTE — Assessment & Plan Note (Signed)
Vision has stabilized

## 2023-05-31 NOTE — Assessment & Plan Note (Signed)
BP Readings from Last 3 Encounters:  05/31/23 138/70  03/15/23 138/78  12/13/22 132/84   Controlled with amlodipine 5 and losartan/hydrochlorothiazide 100/25 Will check labs

## 2023-05-31 NOTE — Assessment & Plan Note (Signed)
I have personally reviewed the Medicare Annual Wellness questionnaire and have noted 1. The patient's medical and social history 2. Their use of alcohol, tobacco or illicit drugs 3. Their current medications and supplements 4. The patient's functional ability including ADL's, fall risks, home safety risks and hearing or visual             impairment. 5. Diet and physical activities 6. Evidence for depression or mood disorders  The patients weight, height, BMI and visual acuity have been recorded in the chart I have made referrals, counseling and provided education to the patient based review of the above and I have provided the pt with a written personalized care plan for preventive services.  I have provided you with a copy of your personalized plan for preventive services. Please take the time to review along with your updated medication list.  Done with cancer screening Had flu and COVID vaccines RSV at the pharmacy Does exercise regularly

## 2023-05-31 NOTE — Progress Notes (Signed)
Subjective:    Patient ID: Barbara Murphy, female    DOB: 1941-06-28, 82 y.o.   MRN: [id number]  HPI Here for Medicare wellness visit and follow up of chronic health conditions Reviewed advanced directives Reviewed other doctors---Dr Stewart--derm, Dr Collier [place], [facility] Lezlie Octave), Retina specialist No hospitalizations or surgery Still exercises regularly---walks and resistance training No falls Hearing is okay No depression or anhedonia of note Regular wine in evening No tobacco Independent with instrumental ADLs No worrisome memory issues  Stressed now---son just diagnosed with metastatic prostate cancer  Vision has stabilized Some trouble reading---uses tablet to enlarge  No chest pain or SOB No dizziness or syncope No headaches No palpitations No edema BP fine when checked [facility]  Current Outpatient Medications on File Prior to Visit  Medication Sig Dispense Refill   Alpha-Lipoic Acid 200 MG CAPS Take 1 capsule by mouth daily.     amLODipine (NORVASC) 5 MG tablet TAKE 1 TABLET EVERY DAY 90 tablet 3   b complex vitamins tablet Take 1 tablet by mouth daily.     Coenzyme Q10 200 MG capsule Take 200 mg by mouth daily.     losartan-hydrochlorothiazide (HYZAAR) 100-25 MG tablet TAKE 1 TABLET EVERY DAY 90 tablet 3   Magnesium 200 MG TABS Take 1 tablet by mouth daily.     Multiple Vitamins-Minerals (PRESERVISION AREDS 2 PO) Take by mouth.     NIACINAMIDE PO Take 1 each by mouth daily.     Omega-3 Fatty Acids (FISH OIL) 1000 MG CAPS Take 1 capsule by mouth daily.     Polypodium Leucotomos (HELIOCARE PO) Take 1 capsule by mouth daily at 12 noon.     Probiotic Product (PROBIOTIC PO) Take 1 capsule by mouth daily.     Turmeric 500 MG CAPS Take by mouth.     Vitamin D, Cholecalciferol, 1000 units CAPS Take 2 capsules by mouth.     No current facility-administered medications on file prior to visit.    Allergies  Allergen Reactions   Sulfa  Antibiotics Hives   Other    Metoprolol Other (See Comments)    Personality change, fatigue    Past Medical History:  Diagnosis Date   Actinic keratosis 04/14/2021   L shoulder, Lichenoid AK, needs LN2   Collagenous colitis    Hypertension    Melanoma (HCC)    Melanoma in situ (HCC) 07/2018   left arm    Past Surgical History:  Procedure Laterality Date   BREAST BIOPSY  1962 & 1980   benign   CARDIOVASCULAR STRESS TEST  01/2015   Nuclear---negative   DEXA  2005   normal   MELANOMA EXCISION     multiple--- [facility]   STRABISMUS SURGERY  1980   TONSILLECTOMY      Family History  Problem Relation Age of Onset   Cancer Mother        liver   Parkinson's disease Father    Stroke Sister    Heart disease Sister    Diabetes Brother    Heart disease Brother    Cancer Son        Prostate    Social History   Socioeconomic History   Marital status: Widowed    Spouse name: Not on file   Number of children: 7   Years of education: Not on file   Highest education level: Not on file  Occupational History   Occupation: Runner, 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 Widowed 2020   2 children, 5 stepchildren   Multiple grandchildren and great grandchildren      Has living will   Son Thayer Ohm has health care POA   Would accept resuscitation attempts   Would leave tube feeds up to son   Social Determinants of Health   Financial Resource Strain: Not on file  Food Insecurity: Not on file  Transportation Needs: Not on file  Physical Activity: Not on file   Stress: Not on file  Social Connections: Not on file  Intimate Partner Violence: Not on file   Review of Systems Appetite is good Weight is fairly stable Sleeps okay usually Wears seat belt Teeth are okay---partial dentures. Keeps up with dentist Has spot on left forearm to check---nothing suspicious  No urinary problems Past colitis---collagenous.. Flare recently---used pepto bismol and probiotic and has improved Rare right hip pain---resolves without meds. No other joint issues No heartburn or dysphagia    Objective:   Physical Exam Constitutional:      Appearance: Normal appearance.  HENT:     Mouth/Throat:     Pharynx: No oropharyngeal exudate or posterior oropharyngeal erythema.  Eyes:     Conjunctiva/sclera: Conjunctivae normal.     Pupils: Pupils are equal, round, and reactive to light.  Cardiovascular:     Rate and Rhythm: Normal rate and regular rhythm.     Pulses: Normal pulses.     Heart sounds:     No gallop.     Comments: Loud coarse aortic systolic murmur Pulmonary:     Effort: Pulmonary effort is normal.     Breath sounds: Normal breath sounds. No wheezing or rales.  Abdominal:     Palpations: Abdomen is soft.     Tenderness: There is no abdominal tenderness.  Musculoskeletal:     Cervical back: Neck supple.     Right lower leg: No edema.     Left lower leg: No edema.  Lymphadenopathy:     Cervical: No cervical adenopathy.  Skin:    Findings: No rash.  Neurological:     General: No focal deficit present.     Mental Status: She is alert and oriented to person, place, and time.     Comments: Word naming---11/30 seconds Recall 3/3  Psychiatric:        Mood and Affect: Mood normal.        Behavior: Behavior normal.            Assessment & Plan:

## 2023-05-31 NOTE — Assessment & Plan Note (Signed)
No action needed

## 2023-05-31 NOTE — Assessment & Plan Note (Signed)
Recent flare Doing better with pepto bismol and probiotic

## 2023-06-01 ENCOUNTER — Telehealth: Payer: Self-pay | Admitting: Internal Medicine

## 2023-06-01 NOTE — Telephone Encounter (Signed)
See labs 

## 2023-06-01 NOTE — Telephone Encounter (Signed)
Pt called back returning Shannon's call regarding results. Pt requested a call back @ [phone]

## 2023-06-05 ENCOUNTER — Ambulatory Visit: Payer: Medicare PPO | Admitting: Dermatology

## 2023-06-05 ENCOUNTER — Encounter: Payer: Self-pay | Admitting: Dermatology

## 2023-06-05 DIAGNOSIS — L570 Actinic keratosis: Secondary | ICD-10-CM

## 2023-06-05 DIAGNOSIS — W908XXA Exposure to other nonionizing radiation, initial encounter: Secondary | ICD-10-CM | POA: Diagnosis not present

## 2023-06-05 DIAGNOSIS — D2230 Melanocytic nevi of unspecified part of face: Secondary | ICD-10-CM

## 2023-06-05 DIAGNOSIS — L821 Other seborrheic keratosis: Secondary | ICD-10-CM | POA: Diagnosis not present

## 2023-06-05 DIAGNOSIS — L304 Erythema intertrigo: Secondary | ICD-10-CM

## 2023-06-05 DIAGNOSIS — L820 Inflamed seborrheic keratosis: Secondary | ICD-10-CM

## 2023-06-05 DIAGNOSIS — L219 Seborrheic dermatitis, unspecified: Secondary | ICD-10-CM

## 2023-06-05 DIAGNOSIS — Z808 Family history of malignant neoplasm of other organs or systems: Secondary | ICD-10-CM

## 2023-06-05 DIAGNOSIS — Z86006 Personal history of melanoma in-situ: Secondary | ICD-10-CM

## 2023-06-05 DIAGNOSIS — L814 Other melanin hyperpigmentation: Secondary | ICD-10-CM

## 2023-06-05 DIAGNOSIS — L578 Other skin changes due to chronic exposure to nonionizing radiation: Secondary | ICD-10-CM | POA: Diagnosis not present

## 2023-06-05 DIAGNOSIS — Z85828 Personal history of other malignant neoplasm of skin: Secondary | ICD-10-CM

## 2023-06-05 DIAGNOSIS — D1801 Hemangioma of skin and subcutaneous tissue: Secondary | ICD-10-CM

## 2023-06-05 DIAGNOSIS — L738 Other specified follicular disorders: Secondary | ICD-10-CM | POA: Diagnosis not present

## 2023-06-05 DIAGNOSIS — Z86018 Personal history of other benign neoplasm: Secondary | ICD-10-CM

## 2023-06-05 DIAGNOSIS — Z85820 Personal history of malignant melanoma of skin: Secondary | ICD-10-CM

## 2023-06-05 DIAGNOSIS — D229 Melanocytic nevi, unspecified: Secondary | ICD-10-CM

## 2023-06-05 DIAGNOSIS — Z1283 Encounter for screening for malignant neoplasm of skin: Secondary | ICD-10-CM

## 2023-06-05 MED ORDER — PIMECROLIMUS 1 % EX CREA: TOPICAL_CREAM | CUTANEOUS | 2 refills | Status: DC

## 2023-06-05 NOTE — Progress Notes (Signed)
Follow-Up Visit   Subjective  Barbara Murphy is a 82 y.o. female who presents for the following: Skin Cancer Screening and Full Body Skin Exam. Hx of MM, Hx of MIS, HxBCC, Hx of dysplastic nevus. Areas of concern. Spot on neck gets irritated Family Hx of MM. Brother and sister.   The patient presents for Total-Body Skin Exam (TBSE) for skin cancer screening and mole check. The patient has spots, moles and lesions to be evaluated, some may be new or changing and the patient may have concern these could be cancer.    The following portions of the chart were reviewed this encounter and updated as appropriate: medications, allergies, medical history  Review of Systems:  No other skin or systemic complaints except as noted in HPI or Assessment and Plan.  Objective  Well appearing patient in no apparent distress; mood and affect are within normal limits.  A full examination was performed including scalp, head, eyes, ears, nose, lips, neck, chest, axillae, abdomen, back, buttocks, bilateral upper extremities, bilateral lower extremities, hands, feet, fingers, toes, fingernails, and toenails. All findings within normal limits unless otherwise noted below.   Relevant physical exam findings are noted in the Assessment and Plan.  Right Nasal tip x1 Erythematous thin papules/macules with gritty scale.   right anterior neck x1 Erythematous keratotic or waxy stuck-on papule    Assessment & Plan   HISTORY OF MELANOMA IN SITU. Lentigo maligna type. Left lateral proximal upper arm. Excised 07/2018. [facility]. Left superior lateral buccal cheek. Mohs 03/17/2008. Left posterior upper arm. Bx 04/07/2011, excised 05/04/2011. - No evidence of recurrence today - Recommend regular full body skin exams - Recommend daily broad spectrum sunscreen SPF 30+ to sun-exposed areas, reapply every 2 hours as needed.  - Call if any new or changing lesions are noted between office visits   HISTORY OF  MELANOMA.  Left distal posterior upper arm.  - No evidence of recurrence today - Recommend regular full body skin exams - Recommend daily broad spectrum sunscreen SPF 30+ to sun-exposed areas, reapply every 2 hours as needed.  - Call if any new or changing lesions are noted between office visits   HISTORY OF BASAL CELL CARCINOMA OF THE SKIN. Right forearm. Excised 08/04/2008. - No evidence of recurrence today - Recommend regular full body skin exams - Recommend daily broad spectrum sunscreen SPF 30+ to sun-exposed areas, reapply every 2 hours as needed.  - Call if any new or changing lesions are noted between office visits   HISTORY OF DYSPLASTIC NEVUS. Left lateral upper back. Mod-severe atypia. Bx: 08/29/2013. Excised 11/14/2013. No evidence of recurrence today Recommend regular full body skin exams Recommend daily broad spectrum sunscreen SPF 30+ to sun-exposed areas, reapply every 2 hours as needed.  Call if any new or changing lesions are noted between office visits  FAMILY HISTORY OF SKIN CANCER What type(s): Melanoma Who affected: Brother and sister.    SKIN CANCER SCREENING PERFORMED TODAY.  ACTINIC DAMAGE - Chronic condition, secondary to cumulative UV/sun exposure - diffuse scaly erythematous macules with underlying dyspigmentation - Recommend daily broad spectrum sunscreen SPF 30+ to sun-exposed areas, reapply every 2 hours as needed.  - Staying in the shade or wearing long sleeves, sun glasses (UVA+UVB protection) and wide brim hats (4-inch brim around the entire circumference of the hat) are also recommended for sun protection.  - Call for new or changing lesions.  LENTIGINES, SEBORRHEIC KERATOSES, HEMANGIOMAS - Benign normal skin lesions. Face, back - Benign-appearing -  Call for any changes  MELANOCYTIC NEVI. Face. - Tan-brown and/or pink-flesh-colored symmetric macules and papules - Benign appearing on exam today - Observation - Call clinic for new or changing  moles - Recommend daily use of broad spectrum spf 30+ sunscreen to sun-exposed areas.   SEBORRHEIC KERATOSIS. Left ventral forearm.  - Stuck-on, waxy, tan-brown papules and/or plaques  - Benign-appearing - Discussed benign etiology and prognosis. - Observe - Call for any changes   Sebaceous Hyperplasia - Small yellow papules with a central dell at face. - Benign-appearing - Observe. Call for changes.   INTERTRIGO vs irritant dermatitis  Exam: anterior neck in fold Erythematous macerated patches  Chronic and persistent condition with duration or expected duration over one year. Condition is bothersome/symptomatic for patient. Currently flared.   Intertrigo is a chronic recurrent rash that occurs in skin fold areas that may be associated with friction; heat; moisture; yeast; fungus; and bacteria.  It is exacerbated by increased movement / activity; sweating; and higher atmospheric temperature.  Treatment Plan  Start Pimecrolimus twice daily to affected area on neck as needed for rash/irritation.   SEBORRHEIC DERMATITIS Exam: Pink scaly patches at frontal scalp and occipital, pink scaliness BL alar crease  Chronic and persistent condition with duration or expected duration over one year. Condition is symptomatic/ bothersome to patient. Not currently at goal.   Seborrheic Dermatitis is a chronic persistent rash characterized by pinkness and scaling most commonly of the mid face but also can occur on the scalp (dandruff), ears; mid chest, mid back and groin.  It tends to be exacerbated by stress and cooler weather.  People who have neurologic disease may experience new onset or exacerbation of existing seborrheic dermatitis.  The condition is not curable but treatable and can be controlled.  Treatment Plan: Recommend OTC Head & Shoulders shampoo 2-3x per week, massage into scalp and let sit 3-5 minutes before rinsing.   Can use Pimecrolimus twice daily to scaly affected areas on  face/nose.    AK (actinic keratosis) Right Nasal tip x1  Actinic keratoses are precancerous spots that appear secondary to cumulative UV radiation exposure/sun exposure over time. They are chronic with expected duration over 1 year. A portion of actinic keratoses will progress to squamous cell carcinoma of the skin. It is not possible to reliably predict which spots will progress to skin cancer and so treatment is recommended to prevent development of skin cancer.  Recommend daily broad spectrum sunscreen SPF 30+ to sun-exposed areas, reapply every 2 hours as needed.  Recommend staying in the shade or wearing long sleeves, sun glasses (UVA+UVB protection) and wide brim hats (4-inch brim around the entire circumference of the hat). Call for new or changing lesions.  Destruction of lesion - Right Nasal tip x1  Destruction method: cryotherapy   Informed consent: discussed and consent obtained   Lesion destroyed using liquid nitrogen: Yes   Region frozen until ice ball extended beyond lesion: Yes   Outcome: patient tolerated procedure well with no complications   Post-procedure details: wound care instructions given   Additional details:  Prior to procedure, discussed risks of blister formation, small wound, skin dyspigmentation, or rare scar following cryotherapy. Recommend Vaseline ointment to treated areas while healing.   Inflamed seborrheic keratosis right anterior neck x1  Symptomatic, irritating, patient would like treated.  Destruction of lesion - right anterior neck x1  Destruction method: cryotherapy   Informed consent: discussed and consent obtained   Lesion destroyed using liquid nitrogen: Yes  Region frozen until ice ball extended beyond lesion: Yes   Outcome: patient tolerated procedure well with no complications   Post-procedure details: wound care instructions given   Additional details:  Prior to procedure, discussed risks of blister formation, small wound, skin  dyspigmentation, or rare scar following cryotherapy. Recommend Vaseline ointment to treated areas while healing.    Return for TBSE, HxMM, HxMIS in 6-12 months .  I, Lawson Radar, CMA, am acting as scribe for Willeen Niece, MD.   Documentation: I have reviewed the above documentation for accuracy and completeness, and I agree with the above.  Willeen Niece, MD

## 2023-06-05 NOTE — Patient Instructions (Addendum)
Cryotherapy Aftercare  Wash gently with soap and water everyday.   Apply Vaseline Jelly daily until healed.   Neck: Start Pimecrolimus twice daily to affected area on neck as needed for rash/irritation.  Scalp: Recommend OTC Head & Shoulders shampoo 2-3x per week, massage into scalp and let sit 3-5 minutes before rinsing.   Can use Pimecrolimus twice daily to scaly affected areas on face.    Recommend daily broad spectrum sunscreen SPF 30+ to sun-exposed areas, reapply every 2 hours as needed. Call for new or changing lesions.  Staying in the shade or wearing long sleeves, sun glasses (UVA+UVB protection) and wide brim hats (4-inch brim around the entire circumference of the hat) are also recommended for sun protection.     Melanoma ABCDEs  Melanoma is the most dangerous type of skin cancer, and is the leading cause of death from skin disease.  You are more likely to develop melanoma if you: Have light-colored skin, light-colored eyes, or red or blond hair Spend a lot of time in the sun Tan regularly, either outdoors or in a tanning bed Have had blistering sunburns, especially during childhood Have a close family member who has had a melanoma Have atypical moles or large birthmarks  Early detection of melanoma is key since treatment is typically straightforward and cure rates are extremely high if we catch it early.   The first sign of melanoma is often a change in a mole or a new dark spot.  The ABCDE system is a way of remembering the signs of melanoma.  A for asymmetry:  The two halves do not match. B for border:  The edges of the growth are irregular. C for color:  A mixture of colors are present instead of an even brown color. D for diameter:  Melanomas are usually (but not always) greater than 6mm - the size of a pencil eraser. E for evolution:  The spot keeps changing in size, shape, and color.  Please check your skin once per month between visits. You can use a small  mirror in front and a large mirror behind you to keep an eye on the back side or your body.   If you see any new or changing lesions before your next follow-up, please call to schedule a visit.  Please continue daily skin protection including broad spectrum sunscreen SPF 30+ to sun-exposed areas, reapplying every 2 hours as needed when you're outdoors.   Staying in the shade or wearing long sleeves, sun glasses (UVA+UVB protection) and wide brim hats (4-inch brim around the entire circumference of the hat) are also recommended for sun protection.      Seborrheic Keratosis  What causes seborrheic keratoses? Seborrheic keratoses are harmless, common skin growths that first appear during adult life.  As time goes by, more growths appear.  Some people may develop a large number of them.  Seborrheic keratoses appear on both covered and uncovered body parts.  They are not caused by sunlight.  The tendency to develop seborrheic keratoses can be inherited.  They vary in color from skin-colored to gray, brown, or even black.  They can be either smooth or have a rough, warty surface.   Seborrheic keratoses are superficial and look as if they were stuck on the skin.  Under the microscope this type of keratosis looks like layers upon layers of skin.  That is why at times the top layer may seem to fall off, but the rest of the growth remains and re-grows.  Treatment Seborrheic keratoses do not need to be treated, but can easily be removed in the office.  Seborrheic keratoses often cause symptoms when they rub on clothing or jewelry.  Lesions can be in the way of shaving.  If they become inflamed, they can cause itching, soreness, or burning.  Removal of a seborrheic keratosis can be accomplished by freezing, burning, or surgery. If any spot bleeds, scabs, or grows rapidly, please return to have it checked, as these can be an indication of a skin cancer.     Due to recent changes in healthcare laws, you  may see results of your pathology and/or laboratory studies on MyChart before the doctors have had a chance to review them. We understand that in some cases there may be results that are confusing or concerning to you. Please understand that not all results are received at the same time and often the doctors may need to interpret multiple results in order to provide you with the best plan of care or course of treatment. Therefore, we ask that you please give [place] 2 business days to thoroughly review all your results before contacting the office for clarification. Should we see a critical lab result, you will be contacted sooner.   If You Need Anything After Your Visit  If you have any questions or concerns for your doctor, please call our main line at [phone] and press option 4 to reach your doctor's medical assistant. If no one answers, please leave a voicemail as directed and we will return your call as soon as possible. Messages left after 4 pm will be answered the following business day.   You may also send [place] a message via MyChart. We typically respond to MyChart messages within 1-2 business days.  For prescription refills, please ask your pharmacy to contact our office. Our fax number is [phone].  If you have an urgent issue when the clinic is closed that cannot wait until the next business day, you can page your doctor at the number below.    Please note that while we do our best to be available for urgent issues outside of office hours, we are not available 24/7.   If you have an urgent issue and are unable to reach [place], you may choose to seek medical care at your doctor's office, retail clinic, urgent care center, or emergency room.  If you have a medical emergency, please immediately call 911 or go to the emergency department.  Pager Numbers  - Dr. Gwen Pounds: [phone]  - Dr. Roseanne Reno: [phone]  - Dr. Katrinka Blazing: [phone]   In the event of inclement weather, please  call our main line at [phone] for an update on the status of any delays or closures.  Dermatology Medication Tips: Please keep the boxes that topical medications come in in order to help keep track of the instructions about where and how to use these. Pharmacies typically print the medication instructions only on the boxes and not directly on the medication tubes.   If your medication is too expensive, please contact our office at [phone] option 4 or send [place] a message through MyChart.   We are unable to tell what your co-pay for medications will be in advance as this is different depending on your insurance coverage. However, we may be able to find a substitute medication at lower cost or fill out paperwork to get insurance to cover a needed medication.   If a prior authorization is required to get your medication  covered by your insurance company, please allow [place] 1-2 business days to complete this process.  Drug prices often vary depending on where the prescription is filled and some pharmacies may offer cheaper prices.  The website www.goodrx.com contains coupons for medications through different pharmacies. The prices here do not account for what the cost may be with help from insurance (it may be cheaper with your insurance), but the website can give you the price if you did not use any insurance.  - You can print the associated coupon and take it with your prescription to the pharmacy.  - You may also stop by our office during regular business hours and pick up a GoodRx coupon card.  - If you need your prescription sent electronically to a different pharmacy, notify our office through [facility] or by phone at [phone] option 4.     Si Usted Necesita Algo Despus de Su Visita  Tambin puede enviarnos un mensaje a travs de Clinical cytogeneticist. Por lo general respondemos a los mensajes de MyChart en el transcurso de 1 a 2 das hbiles.  Para renovar recetas, por favor pida a  su farmacia que se ponga en contacto con nuestra oficina. Annie Sable de fax es [place] [phone].  Si tiene un asunto urgente cuando la clnica est cerrada y que no puede esperar hasta el siguiente da hbil, puede llamar/localizar a su doctor(a) al nmero que aparece a continuacin.   Por favor, tenga en cuenta que aunque hacemos todo lo posible para estar disponibles para asuntos urgentes fuera del horario de [place], no estamos disponibles las 24 horas del da, los 7 [address]  [address] de la [place].   Si tiene un problema urgente y no puede comunicarse con nosotros, puede optar por buscar atencin mdica  en el consultorio de su doctor(a), en una clnica privada, en un centro de atencin urgente o en una sala de emergencias.  Si tiene Engineer, drilling, por favor llame inmediatamente al 911 o vaya a la sala de emergencias.  Nmeros de bper  - Dr. Gwen Pounds: [phone]  - Dra. Roseanne Reno: [phone]  - Dr. Katrinka Blazing: [phone]   En caso de inclemencias del tiempo, por favor llame a Lacy Duverney principal al [phone] para una actualizacin sobre el [place] de cualquier retraso o cierre.  Consejos para la medicacin en dermatologa: Por favor, guarde las cajas en las que vienen los medicamentos de uso tpico para ayudarle a seguir las instrucciones sobre dnde y cmo usarlos. Las farmacias generalmente imprimen las instrucciones del medicamento slo en las cajas y no directamente en los tubos del [place].   Si su medicamento es muy caro, por favor, pngase en contacto con Rolm Gala llamando al [phone] y presione la opcin 4 o envenos un mensaje a travs de Clinical cytogeneticist.   No podemos decirle cul ser su copago por los medicamentos por adelantado ya que esto es diferente dependiendo de la cobertura de su seguro. Sin embargo, es posible que podamos encontrar un medicamento sustituto a Audiological scientist un formulario para que el seguro cubra el medicamento que se considera necesario.    Si se requiere una autorizacin previa para que su compaa de seguros [place] su medicamento, por favor permtanos de 1 a 2 das hbiles para completar [address]  Los precios de los medicamentos varan con frecuencia dependiendo del Environmental consultant de dnde se surte la receta y alguna farmacias pueden ofrecer precios ms baratos.  El sitio web www.goodrx.com tiene cupones para medicamentos de Health and safety inspector. Los precios  aqu no tienen en cuenta lo que podra costar con la ayuda del seguro (puede ser ms barato con su seguro), pero el sitio web puede darle el precio si no Visual merchandiser.  - Puede imprimir el cupn correspondiente y llevarlo con su receta a la farmacia.  - Tambin puede pasar por nuestra oficina durante el horario de atencin regular y Education officer, museum una tarjeta de cupones de GoodRx.  - Si necesita que su receta se enve electrnicamente a una farmacia diferente, informe a nuestra oficina a travs de MyChart de [place] o por telfono llamando al [phone] y presione la opcin 4.

## 2023-06-16 ENCOUNTER — Ambulatory Visit (INDEPENDENT_AMBULATORY_CARE_PROVIDER_SITE_OTHER): Payer: Medicare PPO | Admitting: Internal Medicine

## 2023-06-16 ENCOUNTER — Encounter: Payer: Self-pay | Admitting: Internal Medicine

## 2023-06-16 VITALS — BP 112/78 | HR 74 | Temp 98.5°F | Ht 63.75 in | Wt 164.0 lb

## 2023-06-16 DIAGNOSIS — I10 Essential (primary) hypertension: Secondary | ICD-10-CM | POA: Diagnosis not present

## 2023-06-16 DIAGNOSIS — E871 Hypo-osmolality and hyponatremia: Secondary | ICD-10-CM | POA: Insufficient documentation

## 2023-06-16 LAB — RENAL FUNCTION PANEL
Albumin: 4.4 g/dL (ref 3.5–5.2)
BUN: 14 mg/dL (ref 6–23)
CO2: 30 meq/L (ref 19–32)
Calcium: 9.3 mg/dL (ref 8.4–10.5)
Chloride: 97 meq/L (ref 96–112)
Creatinine, Ser: 0.63 mg/dL (ref 0.40–1.20)
GFR: 81.47 mL/min (ref >60.00)
Glucose, Bld: 98 mg/dL (ref 70–99)
Phosphorus: 3.3 mg/dL (ref 2.3–4.6)
Potassium: 3.7 meq/L (ref 3.5–5.1)
Sodium: 134 meq/L — ABNORMAL LOW (ref 135–145)

## 2023-06-16 NOTE — Assessment & Plan Note (Signed)
Off the losartan and hydrochlorothiazide Will recheck

## 2023-06-16 NOTE — Progress Notes (Signed)
Subjective:    Patient ID: Barbara Murphy, female    DOB: July 07, 1941, 82 y.o.   MRN: [id number]  HPI Here for follow up of hyponatremia and HTN  Has not been checking her BP Feels okay off the med No headaches No chest pain or SOB  Current Outpatient Medications on File Prior to Visit  Medication Sig Dispense Refill   Alpha-Lipoic Acid 200 MG CAPS Take 1 capsule by mouth daily.     amLODipine (NORVASC) 5 MG tablet TAKE 1 TABLET EVERY DAY 90 tablet 3   b complex vitamins tablet Take 1 tablet by mouth daily.     Coenzyme Q10 200 MG capsule Take 200 mg by mouth daily.     Magnesium 200 MG TABS Take 1 tablet by mouth daily.     Multiple Vitamins-Minerals (PRESERVISION AREDS 2 PO) Take by mouth.     NIACINAMIDE PO Take 1 each by mouth daily.     Omega-3 Fatty Acids (FISH OIL) 1000 MG CAPS Take 1 capsule by mouth daily.     Probiotic Product (PROBIOTIC PO) Take 1 capsule by mouth daily.     Turmeric 500 MG CAPS Take by mouth.     Vitamin D, Cholecalciferol, 1000 units CAPS Take 2 capsules by mouth.     No current facility-administered medications on file prior to visit.    Allergies  Allergen Reactions   Sulfa Antibiotics Hives   Other    Metoprolol Other (See Comments)    Personality change, fatigue    Past Medical History:  Diagnosis Date   Actinic keratosis 04/14/2021   L shoulder, Lichenoid AK, needs LN2   Basal cell carcinoma 08/04/2008   Right forearm. Excised 08/04/2008.   Collagenous colitis    Dysplastic nevus 08/29/2013   Left lateral upper back. Moderate to severe atypia. Excised 11/14/2013   Hypertension    Melanoma (HCC)    Left distal posterior upper arm.   Melanoma in situ (HCC) 07/2018   Left lateral proximal upper arm. Bx: 07/04/2018   Melanoma in situ (HCC) 04/07/2011   Left posterior upper arm. Excised 05/04/2011.   Melanoma in situ (HCC) 03/17/2008   Left superior lateral buccal cheek. Mohs, Dr. Adriana Simas    Past Surgical History:  Procedure Laterality  Date   BREAST BIOPSY  1962 & 1980   benign   CARDIOVASCULAR STRESS TEST  01/2015   Nuclear---negative   DEXA  2005   normal   MELANOMA EXCISION     multiple--- [facility]   STRABISMUS SURGERY  1980   TONSILLECTOMY      Family History  Problem Relation Age of Onset   Cancer Mother        liver   Parkinson's disease Father    Stroke Sister    Heart disease Sister    Diabetes Brother    Heart disease Brother    Cancer Son        Prostate    Social History   Socioeconomic History   Marital status: Widowed    Spouse name: Not on file   Number of children: 7   Years of education: Not on file   Highest education level: Not on file  Occupational History   Occupation: Teacher-- [employer]    Comment: Retired  Tobacco Use   Smoking status: Former    Current packs/day: 0.00    Types: Cigarettes    Quit date: 07/25/1968    Years since quitting: 54.9    Passive exposure:  Never   Smokeless tobacco: Never  Substance and Sexual Activity   Alcohol use: Not on file   Drug use: Not on file   Sexual activity: Not on file  Other Topics Concern   Not on file  Social History Narrative   Second marriage-- 43. Widowed 2020   2 children, 5 stepchildren   Multiple grandchildren and great grandchildren      Has living will   Son Thayer Ohm has health care POA   Would accept resuscitation attempts   Would leave tube feeds up to son   Social Determinants of Health   Financial Resource Strain: Not on file  Food Insecurity: Not on file  Transportation Needs: Not on file  Physical Activity: Not on file  Stress: Not on file  Social Connections: Not on file  Intimate Partner Violence: Not on file   Review of Systems Appetite is fine Weight stable Sleep is not great---some night awakening and then not sleep soundly. No daytime somnolence     Objective:   Physical Exam Constitutional:      Appearance: Normal appearance.  Cardiovascular:     Rate and Rhythm:  Normal rate and regular rhythm.     Heart sounds: No murmur heard.    No gallop.  Pulmonary:     Effort: Pulmonary effort is normal.     Breath sounds: Normal breath sounds. No wheezing or rales.  Musculoskeletal:     Cervical back: Neck supple.     Right lower leg: No edema.     Left lower leg: No edema.  Lymphadenopathy:     Cervical: No cervical adenopathy.  Neurological:     Mental Status: She is alert.            Assessment & Plan:

## 2023-06-16 NOTE — Assessment & Plan Note (Signed)
BP Readings from Last 3 Encounters:  06/16/23 112/78  05/31/23 138/70  03/15/23 138/78   Repeat on right 136/84 Will just continue the amlodipine 5mg  Consider restarting lower dose losartan if goes up

## 2023-09-12 DIAGNOSIS — H43813 Vitreous degeneration, bilateral: Secondary | ICD-10-CM | POA: Diagnosis not present

## 2023-09-12 DIAGNOSIS — H35372 Puckering of macula, left eye: Secondary | ICD-10-CM | POA: Diagnosis not present

## 2023-09-12 DIAGNOSIS — H353221 Exudative age-related macular degeneration, left eye, with active choroidal neovascularization: Secondary | ICD-10-CM | POA: Diagnosis not present

## 2023-09-12 DIAGNOSIS — H31013 Macula scars of posterior pole (postinflammatory) (post-traumatic), bilateral: Secondary | ICD-10-CM | POA: Diagnosis not present

## 2023-09-12 DIAGNOSIS — H34231 Retinal artery branch occlusion, right eye: Secondary | ICD-10-CM | POA: Diagnosis not present

## 2023-09-12 DIAGNOSIS — H26492 Other secondary cataract, left eye: Secondary | ICD-10-CM | POA: Diagnosis not present

## 2023-09-12 DIAGNOSIS — H43822 Vitreomacular adhesion, left eye: Secondary | ICD-10-CM | POA: Diagnosis not present

## 2023-09-12 DIAGNOSIS — H353113 Nonexudative age-related macular degeneration, right eye, advanced atrophic without subfoveal involvement: Secondary | ICD-10-CM | POA: Diagnosis not present

## 2023-10-12 ENCOUNTER — Ambulatory Visit: Admitting: Internal Medicine

## 2023-10-12 ENCOUNTER — Encounter: Payer: Self-pay | Admitting: General Practice

## 2023-10-12 ENCOUNTER — Ambulatory Visit: Admitting: General Practice

## 2023-10-12 VITALS — BP 160/80 | HR 79 | Temp 97.9°F | Ht 63.75 in | Wt 167.0 lb

## 2023-10-12 DIAGNOSIS — I10 Essential (primary) hypertension: Secondary | ICD-10-CM

## 2023-10-12 LAB — COMPREHENSIVE METABOLIC PANEL
ALT: 15 U/L (ref 0–35)
AST: 19 U/L (ref 0–37)
Albumin: 4.9 g/dL (ref 3.5–5.2)
Alkaline Phosphatase: 56 U/L (ref 39–117)
BUN: 21 mg/dL (ref 6–23)
CO2: 29 meq/L (ref 19–32)
Calcium: 9.7 mg/dL (ref 8.4–10.5)
Chloride: 98 meq/L (ref 96–112)
Creatinine, Ser: 0.6 mg/dL (ref 0.40–1.20)
GFR: 82.25 mL/min (ref >60.00)
Glucose, Bld: 104 mg/dL — ABNORMAL HIGH (ref 70–99)
Potassium: 4.4 meq/L (ref 3.5–5.1)
Sodium: 135 meq/L (ref 135–145)
Total Bilirubin: 0.7 mg/dL (ref 0.2–1.2)
Total Protein: 8.2 g/dL (ref 6.0–8.3)

## 2023-10-12 LAB — CBC
HCT: 42 % (ref 36.0–46.0)
Hemoglobin: 13.9 g/dL (ref 12.0–15.0)
MCHC: 33.1 g/dL (ref 30.0–36.0)
MCV: 86 fl (ref 78.0–100.0)
Platelets: 379 10*3/uL (ref 150.0–400.0)
RBC: 4.89 Mil/uL (ref 3.87–5.11)
RDW: 16.3 % — ABNORMAL HIGH (ref 11.5–15.5)
WBC: 6.6 10*3/uL (ref 4.0–10.5)

## 2023-10-12 MED ORDER — LOSARTAN POTASSIUM 50 MG PO TABS: 50.0000 mg | ORAL_TABLET | Freq: Every day | ORAL | 0 refills | Status: DC

## 2023-10-12 NOTE — Assessment & Plan Note (Addendum)
 BP Readings from Last 3 Encounters:  10/12/23 (!) 160/80  06/16/23 112/78  05/31/23 138/70    BP elevated on both readings 190/90 and 160/80.  Neuro exam stable. Patient's clinical presentation is non concerning. No red flags.   Discussed treatment options at length.   Will restart Losartan 50 mg once daily. Discussed orthostatic hypotension and other side effects.   She will send her BP readings in week via mychart or phone call.   Follow up in office with PCP or myself in two weeks.   CBC, CMP pending.

## 2023-10-12 NOTE — Progress Notes (Signed)
 Established Patient Office Visit  Subjective   Patient ID: Barbara Murphy, female    DOB: 1940-07-28  Age: 83 y.o. MRN: [id number]  Chief Complaint  Patient presents with   Hypertension    BP was too high this morning for dental surgery.     Hypertension Pertinent negatives include no chest pain, headaches or shortness of breath.    Barbara Murphy is a 83 year old female, patient of Dr. Alphonsus Sias, with past medical history of hypertension, aortic valve sclerosis presents today for an acute visit.   Hypertension: She was scheduled to have eye surgery this morning, and had her BP was 225/104 and 246/122. She reports that she was feeling shaky and noticed that her signature was off as well. She was asked to follow up with PCP. She is currently managed on amlodipine 5 mg once daily. She denies any blurred vision, headache, chest pain or shortness of breath.    Patient Active Problem List   Diagnosis Date Noted   Hyponatremia 06/16/2023   Collagenous colitis    Branch retinal artery occlusion of left eye 03/15/2023   Dry age-related macular degeneration 05/27/2022   Impingement syndrome of right shoulder 05/25/2021   Aortic valve sclerosis 05/22/2020   Preventative health care 04/28/2017   History of melanoma 03/23/2016   Advance directive discussed with patient 03/23/2016   Hypertension    Past Medical History:  Diagnosis Date   Actinic keratosis 04/14/2021   L shoulder, Lichenoid AK, needs LN2   Basal cell carcinoma 08/04/2008   Right forearm. Excised 08/04/2008.   Collagenous colitis    Dysplastic nevus 08/29/2013   Left lateral upper back. Moderate to severe atypia. Excised 11/14/2013   Hypertension    Melanoma (HCC)    Left distal posterior upper arm.   Melanoma in situ (HCC) 07/2018   Left lateral proximal upper arm. Bx: 07/04/2018   Melanoma in situ (HCC) 04/07/2011   Left posterior upper arm. Excised 05/04/2011.   Melanoma in situ (HCC) 03/17/2008   Left superior lateral  buccal cheek. Mohs, Dr. Adriana Simas   Allergies  Allergen Reactions   Sulfa Antibiotics Hives   Other    Metoprolol Other (See Comments)    Personality change, fatigue         05/31/2023   10:45 AM 05/31/2023   10:29 AM 05/27/2022   11:12 AM  Depression screen PHQ 2/9  Decreased Interest 0 0 0  Down, Depressed, Hopeless 0 0 0  PHQ - 2 Score 0 0 0        No data to display            Review of Systems  Constitutional:  Negative for chills and fever.  Respiratory:  Negative for shortness of breath.   Cardiovascular:  Negative for chest pain.  Gastrointestinal:  Negative for abdominal pain, constipation, diarrhea, heartburn, nausea and vomiting.  Genitourinary:  Negative for dysuria, frequency and urgency.  Neurological:  Negative for dizziness and headaches.  Endo/Heme/Allergies:  Negative for polydipsia.  Psychiatric/Behavioral:  Negative for depression and suicidal ideas. The patient is not nervous/anxious.       Objective:     BP (!) 160/80 (BP Location: Left Arm, Patient Position: Sitting, Cuff Size: Normal)   Pulse 79   Temp 97.9 F (36.6 C) (Oral)   Ht 5' 3.75" (1.619 m)   Wt 167 lb (75.8 kg)   SpO2 99%   BMI 28.89 kg/m  BP Readings from Last 3 Encounters:  10/12/23 (!) 160/80  06/16/23 112/78  05/31/23 138/70   Wt Readings from Last 3 Encounters:  10/12/23 167 lb (75.8 kg)  06/16/23 164 lb (74.4 kg)  05/31/23 165 lb (74.8 kg)      Physical Exam Vitals and nursing note reviewed.  Constitutional:      Appearance: Normal appearance.  Cardiovascular:     Rate and Rhythm: Normal rate and regular rhythm.     Pulses: Normal pulses.     Heart sounds: Normal heart sounds.  Pulmonary:     Effort: Pulmonary effort is normal.     Breath sounds: Normal breath sounds.  Neurological:     General: No focal deficit present.     Mental Status: She is alert and oriented to person, place, and time.     Cranial Nerves: Cranial nerves 2-12 are intact.     Sensory:  Sensation is intact.     Motor: Motor function is intact.     Coordination: Finger-Nose-Finger Test and Heel to [place] Test normal.     Gait: Gait is intact.  Psychiatric:        Mood and Affect: Mood normal.        Behavior: Behavior normal.        Thought Content: Thought content normal.        Judgment: Judgment normal.      No results found for any visits on 10/12/23.     The ASCVD Risk score (Arnett DK, et al., 2019) failed to calculate for the following reasons:   The 2019 ASCVD risk score is only valid for ages 26 to 7    Assessment & Plan:  Primary hypertension Assessment & Plan: BP Readings from Last 3 Encounters:  10/12/23 (!) 190/90  06/16/23 112/78  05/31/23 138/70    BP elevated on both readings 190/90 and   Neuro exam stable. Patient's clinical presentation is non concerning. No red flags.   Discussed treatment options at length.   Will restart Losartan 50 mg once daily. Discussed orthostatic hypotension and other side effects.   She will send her BP readings in week via mychart or phone call.   Follow up in office with PCP or myself in two weeks.   CBC, CMP pending.  Orders: -     Comprehensive metabolic panel -     CBC -     Losartan Potassium; Take 1 tablet (50 mg total) by mouth daily.  Dispense: 30 tablet; Refill: 0     Return in about 2 weeks (around 10/26/2023) for BP.    Modesto Charon, NP

## 2023-10-12 NOTE — Patient Instructions (Addendum)
 Stop by the lab prior to leaving today. I will notify you of your results once received.   Start Losartan 50 mg once daily.   Continue Amlodipine 5 mg once daily.   Please sent me your blood pressure readings via my chart in one week.   Follow up with me or Dr. Alphonsus Sias in two weeks.   It was a pleasure meeting you!

## 2023-10-20 ENCOUNTER — Telehealth: Payer: Self-pay | Admitting: Internal Medicine

## 2023-10-20 NOTE — Telephone Encounter (Signed)
 Patient came by the office and dropped off blood pressure readings to [place]. Place in her box up front. Thank you!

## 2023-10-20 NOTE — Telephone Encounter (Signed)
 Readings placed in inbox for review on monday

## 2023-10-23 NOTE — Telephone Encounter (Signed)
 Called and discussed BP readings with patient.   Increase Losartan to 100 mg once daily.   She has a follow up with me on Thursday.

## 2023-10-26 ENCOUNTER — Encounter: Payer: Self-pay | Admitting: General Practice

## 2023-10-26 ENCOUNTER — Ambulatory Visit (INDEPENDENT_AMBULATORY_CARE_PROVIDER_SITE_OTHER): Admitting: General Practice

## 2023-10-26 VITALS — BP 128/74 | HR 70 | Temp 98.1°F | Ht 63.75 in | Wt 167.0 lb

## 2023-10-26 DIAGNOSIS — I10 Essential (primary) hypertension: Secondary | ICD-10-CM | POA: Diagnosis not present

## 2023-10-26 LAB — BASIC METABOLIC PANEL WITH GFR
BUN: 18 mg/dL (ref 6–23)
CO2: 28 meq/L (ref 19–32)
Calcium: 9.3 mg/dL (ref 8.4–10.5)
Chloride: 98 meq/L (ref 96–112)
Creatinine, Ser: 0.6 mg/dL (ref 0.40–1.20)
GFR: 82.23 mL/min (ref >60.00)
Glucose, Bld: 96 mg/dL (ref 70–99)
Potassium: 4.1 meq/L (ref 3.5–5.1)
Sodium: 133 meq/L — ABNORMAL LOW (ref 135–145)

## 2023-10-26 MED ORDER — AMLODIPINE BESYLATE 10 MG PO TABS: 10.0000 mg | ORAL_TABLET | Freq: Every day | ORAL | 0 refills | Status: DC

## 2023-10-26 MED ORDER — LOSARTAN POTASSIUM 100 MG PO TABS: 100.0000 mg | ORAL_TABLET | Freq: Every day | ORAL | 0 refills | Status: DC

## 2023-10-26 NOTE — Patient Instructions (Signed)
 Stop by the lab prior to leaving today. I will notify you of your results once received.   Start monitoring your blood pressure daily, around the same time of day, for the next 2-3 weeks.  Ensure that you have rested for 30 minutes prior to checking your blood pressure.   Record your readings and notify me if you see numbers consistently at or above 130 on top and/or 90 on bottom.  Increase Amlodipine 5 mg to 10 mg once daily. I sent in a new prescription for you.   Continue Losartan 100 mg once daily. I sent in a new prescription for you.   Follow up in two weeks.   It was a pleasure to see you today!

## 2023-10-26 NOTE — Progress Notes (Signed)
 Established Patient Office Visit  Subjective   Patient ID: Barbara Murphy, female    DOB: 24-May-1941  Age: 83 y.o. MRN: [id number]  Chief Complaint  Patient presents with   Hypertension    HPI  Barbara Murphy is a 83 year old female, patient of Dr. Teryl Lucy, presents today for a follow up on hypertension.   Patient sent a mychart message on 10/17/23 with blood pressure readings ranging  between 180s-200s systolic and mid 21H-YQM 100s diastolic. She was asked to increase losartan 50 mg to 100 mg once daily.   Today, she reports that she has increased her medication and has been taking it without missing any doses. Her home readings have improved. Since then, her readings range between 160s to 170s systolic and 70s-80s diastolic. She denies any blurred vision, chest pain, headaches, leg swelling, shortness of breath or difficulty breathing.   Patient Active Problem List   Diagnosis Date Noted   Hyponatremia 06/16/2023   Collagenous colitis    Branch retinal artery occlusion of left eye 03/15/2023   Dry age-related macular degeneration 05/27/2022   Impingement syndrome of right shoulder 05/25/2021   Aortic valve sclerosis 05/22/2020   Preventative health care 04/28/2017   History of melanoma 03/23/2016   Advance directive discussed with patient 03/23/2016   Hypertension    Past Medical History:  Diagnosis Date   Actinic keratosis 04/14/2021   L shoulder, Lichenoid AK, needs LN2   Basal cell carcinoma 08/04/2008   Right forearm. Excised 08/04/2008.   Collagenous colitis    Dysplastic nevus 08/29/2013   Left lateral upper back. Moderate to severe atypia. Excised 11/14/2013   Hypertension    Melanoma (HCC)    Left distal posterior upper arm.   Melanoma in situ (HCC) 07/2018   Left lateral proximal upper arm. Bx: 07/04/2018   Melanoma in situ (HCC) 04/07/2011   Left posterior upper arm. Excised 05/04/2011.   Melanoma in situ (HCC) 03/17/2008   Left superior lateral buccal cheek.  Mohs, Dr. Adriana Simas   Allergies  Allergen Reactions   Sulfa Antibiotics Hives   Other    Metoprolol Other (See Comments)    Personality change, fatigue         10/26/2023   12:41 PM 05/31/2023   10:45 AM 05/31/2023   10:29 AM  Depression screen PHQ 2/9  Decreased Interest 0 0 0  Down, Depressed, Hopeless 0 0 0  PHQ - 2 Score 0 0 0  Altered sleeping 0    Tired, decreased energy 0    Change in appetite 0    Feeling bad or failure about yourself  0    Trouble concentrating 0    Moving slowly or fidgety/restless 0    Suicidal thoughts 0    PHQ-9 Score 0    Difficult doing work/chores Not difficult at all         10/26/2023   12:41 PM  GAD 7 : Generalized Anxiety Score  Nervous, Anxious, on Edge 1  Control/stop worrying 0  Worry too much - different things 0  Trouble relaxing 1  Restless 0  Easily annoyed or irritable 0  Afraid - awful might happen 0  Total GAD 7 Score 2  Anxiety Difficulty Not difficult at all      Review of Systems  Constitutional:  Negative for chills and fever.  Respiratory:  Negative for shortness of breath.   Cardiovascular:  Negative for chest pain and leg swelling.  Gastrointestinal:  Negative for abdominal pain, constipation,  diarrhea, heartburn, nausea and vomiting.  Genitourinary:  Negative for dysuria, frequency and urgency.  Neurological:  Negative for dizziness and headaches.  Endo/Heme/Allergies:  Negative for polydipsia.  Psychiatric/Behavioral:  Negative for depression and suicidal ideas. The patient is not nervous/anxious.       Objective:     BP 128/74 (BP Location: Left Arm, Patient Position: Sitting, Cuff Size: Normal)   Pulse 70   Temp 98.1 F (36.7 C) (Oral)   Ht 5' 3.75" (1.619 m)   Wt 167 lb (75.8 kg)   SpO2 99%   BMI 28.89 kg/m  BP Readings from Last 3 Encounters:  10/26/23 128/74  10/12/23 (!) 160/80  06/16/23 112/78   Wt Readings from Last 3 Encounters:  10/26/23 167 lb (75.8 kg)  10/12/23 167 lb (75.8 kg)   06/16/23 164 lb (74.4 kg)      Physical Exam Vitals and nursing note reviewed.  Constitutional:      Appearance: Normal appearance.  Cardiovascular:     Rate and Rhythm: Normal rate and regular rhythm.     Pulses: Normal pulses.     Heart sounds: Normal heart sounds.  Pulmonary:     Effort: Pulmonary effort is normal.     Breath sounds: Normal breath sounds.  Musculoskeletal:     Right lower leg: No edema.     Left lower leg: No edema.  Neurological:     Mental Status: She is alert and oriented to person, place, and time.  Psychiatric:        Mood and Affect: Mood normal.        Behavior: Behavior normal.        Thought Content: Thought content normal.        Judgment: Judgment normal.     No results found for any visits on 10/26/23.     The ASCVD Risk score (Arnett DK, et al., 2019) failed to calculate for the following reasons:   The 2019 ASCVD risk score is only valid for ages 31 to 79    Assessment & Plan:  Primary hypertension Assessment & Plan: Slightly improved.   Continue Losartan 100 mg once daily at bedtime.  Increase Amlodipine 5 mg to 10 mg once daily.  Rx sent.   Follow up in 2 weeks.  BMP pending.  Orders: -     Basic metabolic panel with GFR -     Losartan Potassium; Take 1 tablet (100 mg total) by mouth daily.  Dispense: 30 tablet; Refill: 0 -     amLODIPine Besylate; Take 1 tablet (10 mg total) by mouth daily.  Dispense: 30 tablet; Refill: 0     Return in about 2 weeks (around 11/09/2023) for blood pressure.    [place] Charon, NP

## 2023-10-26 NOTE — Assessment & Plan Note (Addendum)
 Slightly improved.   Continue Losartan 100 mg once daily at bedtime.  Increase Amlodipine 5 mg to 10 mg once daily.  Rx sent.   Follow up in 2 weeks.  BMP pending.

## 2023-11-08 ENCOUNTER — Ambulatory Visit: Admitting: General Practice

## 2023-11-08 ENCOUNTER — Encounter: Payer: Self-pay | Admitting: General Practice

## 2023-11-08 VITALS — BP 124/82 | HR 93 | Temp 97.0°F | Ht 63.75 in | Wt 166.0 lb

## 2023-11-08 DIAGNOSIS — I10 Essential (primary) hypertension: Secondary | ICD-10-CM | POA: Diagnosis not present

## 2023-11-08 MED ORDER — HYDROCHLOROTHIAZIDE 12.5 MG PO TABS: 12.5000 mg | ORAL_TABLET | Freq: Every day | ORAL | 0 refills | Status: DC

## 2023-11-08 NOTE — Patient Instructions (Signed)
 Continue Amlodipine 10 mg once daily, and losartan 100 mg once daily.   Start hydrochlorothiazide 12.5 mg once daily.  Add slight salt into your diet.   Continue monitoring BP at home.   Follow up in two weeks.   It was a pleasure to see you today!

## 2023-11-08 NOTE — Assessment & Plan Note (Signed)
 Slightly improved today.   Start hydrochlorothiazide 12.5 mg once daily. Rx sent.   Continue monitoring readings at home.   Follow up in two weeks.

## 2023-11-08 NOTE — Progress Notes (Signed)
 Established Patient Office Visit  Subjective   Patient ID: Barbara Murphy, female    DOB: 10-29-1940  Age: 83 y.o. MRN: [id number]  Chief Complaint  Patient presents with   Hypertension    Patient here today to follow up on BP. Patient states it is better but still elevated.     Hypertension Pertinent negatives include no chest pain, headaches or shortness of breath.    Barbara Murphy is a 83 year old female, patient of Dr. Alphonsus Sias, with past medical history of hypertension, aortic valve sclerosis, impingement syndrome of right shoulder presents today for a follow up on hypertension.   Hypertension: Currently managed on Amlodipine 10 mg once daily and losartan 100 mg once daily. Her The home BP readings have been in the 140's-upper 160's / 70's-80's range. She has not missed any doses. She denies blurred vision, chest pain or shortness of breath. No ankle edema.       11/08/2023    2:42 PM 10/26/2023   12:41 PM 05/31/2023   10:45 AM  Depression screen PHQ 2/9  Decreased Interest 0 0 0  Down, Depressed, Hopeless 0 0 0  PHQ - 2 Score 0 0 0  Altered sleeping 0 0   Tired, decreased energy 0 0   Change in appetite 0 0   Feeling bad or failure about yourself  0 0   Trouble concentrating 0 0   Moving slowly or fidgety/restless 0 0   Suicidal thoughts 0 0   PHQ-9 Score 0 0   Difficult doing work/chores Not difficult at all Not difficult at all        11/08/2023    2:42 PM 10/26/2023   12:41 PM  GAD 7 : Generalized Anxiety Score  Nervous, Anxious, on Edge 0 1  Control/stop worrying 0 0  Worry too much - different things 0 0  Trouble relaxing 0 1  Restless 0 0  Easily annoyed or irritable 0 0  Afraid - awful might happen 0 0  Total GAD 7 Score 0 2  Anxiety Difficulty Not difficult at all Not difficult at all      Review of Systems  Constitutional:  Negative for chills and fever.  Respiratory:  Negative for shortness of breath.   Cardiovascular:  Negative for chest pain.   Gastrointestinal:  Negative for abdominal pain, constipation, diarrhea, heartburn, nausea and vomiting.  Genitourinary:  Negative for dysuria, frequency and urgency.  Neurological:  Negative for dizziness and headaches.  Endo/Heme/Allergies:  Negative for polydipsia.  Psychiatric/Behavioral:  Negative for depression and suicidal ideas. The patient is not nervous/anxious.       Objective:     BP (!) 140/72 (BP Location: Left Arm, Patient Position: Sitting, Cuff Size: Normal)   Pulse 93   Temp (!) 97 F (36.1 C) (Oral)   Ht 5' 3.75" (1.619 m)   Wt 166 lb (75.3 kg)   SpO2 98%   BMI 28.72 kg/m     Physical Exam Vitals and nursing note reviewed.  Constitutional:      Appearance: Normal appearance.  Cardiovascular:     Rate and Rhythm: Normal rate and regular rhythm.     Pulses: Normal pulses.     Heart sounds: Normal heart sounds.  Pulmonary:     Effort: Pulmonary effort is normal.     Breath sounds: Normal breath sounds.  Neurological:     Mental Status: She is alert and oriented to person, place, and time.  Psychiatric:  Mood and Affect: Mood normal.        Behavior: Behavior normal.        Thought Content: Thought content normal.        Judgment: Judgment normal.      No results found for any visits on 11/08/23.     The ASCVD Risk score (Arnett DK, et al., 2019) failed to calculate for the following reasons:   The 2019 ASCVD risk score is only valid for ages 54 to 25    Assessment & Plan:  Primary hypertension Assessment & Plan: Slightly improved today.   Start hydrochlorothiazide 12.5 mg once daily. Rx sent.   Continue monitoring readings at home.   Follow up in two weeks.   Orders: -     hydroCHLOROthiazide; Take 1 tablet (12.5 mg total) by mouth daily.  Dispense: 30 tablet; Refill: 0    Return in about 2 weeks (around 11/22/2023) for BP and BMP.    Jolanda Nation, NP

## 2023-11-18 ENCOUNTER — Other Ambulatory Visit: Payer: Self-pay | Admitting: General Practice

## 2023-11-18 DIAGNOSIS — I10 Essential (primary) hypertension: Secondary | ICD-10-CM

## 2023-11-22 ENCOUNTER — Ambulatory Visit: Admitting: Internal Medicine

## 2023-11-23 ENCOUNTER — Encounter: Payer: Self-pay | Admitting: General Practice

## 2023-11-23 ENCOUNTER — Ambulatory Visit (INDEPENDENT_AMBULATORY_CARE_PROVIDER_SITE_OTHER): Admitting: General Practice

## 2023-11-23 VITALS — BP 150/80 | HR 67 | Temp 97.8°F | Ht 63.75 in | Wt 167.0 lb

## 2023-11-23 DIAGNOSIS — I10 Essential (primary) hypertension: Secondary | ICD-10-CM | POA: Diagnosis not present

## 2023-11-23 MED ORDER — LOSARTAN POTASSIUM-HCTZ 100-12.5 MG PO TABS: 1.0000 | ORAL_TABLET | Freq: Every day | ORAL | 0 refills | Status: DC

## 2023-11-23 MED ORDER — SPIRONOLACTONE 25 MG PO TABS: 25.0000 mg | ORAL_TABLET | Freq: Every day | ORAL | 0 refills | Status: DC

## 2023-11-23 NOTE — Patient Instructions (Addendum)
 Continue Amlodipine  10 mg once daily. (At night) Continue Losartan  100 mg - hydrochlorothiazide  12.5mg  - COMBINATION PILL once daily (new one at [facility]). (In the morning) Start spironolactone  25 mg once daily. (New one). (At night). Ok to switch to morning if needed.   Please continue to monitor BP at home.   Follow up in 1-2 weeks.   It was a pleasure to see you today!

## 2023-11-23 NOTE — Assessment & Plan Note (Signed)
 Uncontrolled.   Discussed at length with patient.  Continue Amlodipine  10 mg once daily, losartan  100 mg once daily, hydrochlorothiazide  12.5 mg once daily.   Start Spironolactone  25 mg once daily.   Continue monitor BP at home.  No red flags on exam.  F/u in 1-2 weeks.

## 2023-11-23 NOTE — Progress Notes (Signed)
 Established Patient Office Visit  Subjective   Patient ID: Barbara Murphy, female    DOB: 07-11-1941  Age: 83 y.o. MRN: [id number]  Chief Complaint  Patient presents with   Hypertension    Patient here today for BP check; patient states its coming down but still elevated.     Hypertension Pertinent negatives include no chest pain, headaches or shortness of breath.    Barbara Murphy is a 83 year old female, patient of Dr. Joelle Musca, with past medical history of hypertension, melanoma, collagenous colitis, aortic valve sclerosis presents today for hypertension follow up.   Hypertension: diagnosed several years ago. Currently managed on amlodipine  10 mg, hydrochlorothiazide  12.5 mg, losartan  100 mg once daily. She has been taking her medications without missing any doses. The home BP readings have been in the 150's-160's / 80's range. She denies any blurred vision, chest pain, headaches, shortness of breath or difficulty breathing.   Patient Active Problem List   Diagnosis Date Noted   Hyponatremia 06/16/2023   Collagenous colitis    Branch retinal artery occlusion of left eye 03/15/2023   Dry age-related macular degeneration 05/27/2022   Impingement syndrome of right shoulder 05/25/2021   Aortic valve sclerosis 05/22/2020   Preventative health care 04/28/2017   History of melanoma 03/23/2016   Advance directive discussed with patient 03/23/2016   Hypertension    Past Medical History:  Diagnosis Date   Actinic keratosis 04/14/2021   L shoulder, Lichenoid AK, needs LN2   Basal cell carcinoma 08/04/2008   Right forearm. Excised 08/04/2008.   Collagenous colitis    Dysplastic nevus 08/29/2013   Left lateral upper back. Moderate to severe atypia. Excised 11/14/2013   Hypertension    Melanoma (HCC)    Left distal posterior upper arm.   Melanoma in situ (HCC) 07/2018   Left lateral proximal upper arm. Bx: 07/04/2018   Melanoma in situ (HCC) 04/07/2011   Left posterior upper arm.  Excised 05/04/2011.   Melanoma in situ (HCC) 03/17/2008   Left superior lateral buccal cheek. Mohs, Dr. Debrah Fan   Past Surgical History:  Procedure Laterality Date   BREAST BIOPSY  1962 & 1980   benign   CARDIOVASCULAR STRESS TEST  01/2015   Nuclear---negative   DEXA  2005   normal   MELANOMA EXCISION     multiple--- [facility]   STRABISMUS SURGERY  1980   TONSILLECTOMY     Allergies  Allergen Reactions   Sulfa Antibiotics Hives   Other    Metoprolol  Other (See Comments)    Personality change, fatigue         11/08/2023    2:42 PM 10/26/2023   12:41 PM 05/31/2023   10:45 AM  Depression screen PHQ 2/9  Decreased Interest 0 0 0  Down, Depressed, Hopeless 0 0 0  PHQ - 2 Score 0 0 0  Altered sleeping 0 0   Tired, decreased energy 0 0   Change in appetite 0 0   Feeling bad or failure about yourself  0 0   Trouble concentrating 0 0   Moving slowly or fidgety/restless 0 0   Suicidal thoughts 0 0   PHQ-9 Score 0 0   Difficult doing work/chores Not difficult at all Not difficult at all        11/08/2023    2:42 PM 10/26/2023   12:41 PM  GAD 7 : Generalized Anxiety Score  Nervous, Anxious, on Edge 0 1  Control/stop worrying 0 0  Worry too  much - different things 0 0  Trouble relaxing 0 1  Restless 0 0  Easily annoyed or irritable 0 0  Afraid - awful might happen 0 0  Total GAD 7 Score 0 2  Anxiety Difficulty Not difficult at all Not difficult at all      Review of Systems  Constitutional:  Negative for chills and fever.  Respiratory:  Negative for shortness of breath.   Cardiovascular:  Negative for chest pain.  Gastrointestinal:  Negative for abdominal pain, constipation, diarrhea, heartburn, nausea and vomiting.  Genitourinary:  Negative for dysuria, frequency and urgency.  Neurological:  Negative for dizziness and headaches.  Endo/Heme/Allergies:  Negative for polydipsia.  Psychiatric/Behavioral:  Negative for depression and suicidal ideas. The  patient is not nervous/anxious.       Objective:     BP (!) 150/80   Pulse 67   Temp 97.8 F (36.6 C) (Oral)   Ht 5' 3.75" (1.619 m)   Wt 167 lb (75.8 kg)   SpO2 97%   BMI 28.89 kg/m  BP Readings from Last 3 Encounters:  11/23/23 (!) 150/80  11/08/23 124/82  10/26/23 128/74   Wt Readings from Last 3 Encounters:  11/23/23 167 lb (75.8 kg)  11/08/23 166 lb (75.3 kg)  10/26/23 167 lb (75.8 kg)      Physical Exam Vitals and nursing note reviewed.  Constitutional:      Appearance: Normal appearance.  Cardiovascular:     Rate and Rhythm: Normal rate and regular rhythm.     Pulses: Normal pulses.     Heart sounds: Normal heart sounds.  Pulmonary:     Effort: Pulmonary effort is normal.     Breath sounds: Normal breath sounds.  Neurological:     Mental Status: She is alert and oriented to person, place, and time.  Psychiatric:        Mood and Affect: Mood normal.        Behavior: Behavior normal.        Thought Content: Thought content normal.        Judgment: Judgment normal.      No results found for any visits on 11/23/23.     The ASCVD Risk score (Arnett DK, et al., 2019) failed to calculate for the following reasons:   The 2019 ASCVD risk score is only valid for ages 70 to 22    Assessment & Plan:  Primary hypertension Assessment & Plan: Uncontrolled.   Discussed at length with patient.  Continue Amlodipine  10 mg once daily, losartan  100 mg once daily, hydrochlorothiazide  12.5 mg once daily.   Start Spironolactone  25 mg once daily.   Continue monitor BP at home.  No red flags on exam.  F/u in 1-2 weeks.   Orders: -     Spironolactone ; Take 1 tablet (25 mg total) by mouth daily.  Dispense: 30 tablet; Refill: 0 -     Losartan  Potassium-HCTZ; Take 1 tablet by mouth daily.  Dispense: 30 tablet; Refill: 0     Return in about 12 days (around 12/05/2023) for BMP and BP.    Jolanda Nation, NP

## 2023-12-05 ENCOUNTER — Encounter: Payer: Self-pay | Admitting: General Practice

## 2023-12-05 ENCOUNTER — Other Ambulatory Visit: Payer: Self-pay | Admitting: General Practice

## 2023-12-05 ENCOUNTER — Ambulatory Visit (INDEPENDENT_AMBULATORY_CARE_PROVIDER_SITE_OTHER): Admitting: General Practice

## 2023-12-05 VITALS — BP 134/80 | HR 86 | Temp 97.9°F | Ht 63.75 in | Wt 168.0 lb

## 2023-12-05 DIAGNOSIS — I10 Essential (primary) hypertension: Secondary | ICD-10-CM | POA: Diagnosis not present

## 2023-12-05 LAB — BASIC METABOLIC PANEL WITH GFR
BUN: 16 mg/dL (ref 6–23)
CO2: 27 meq/L (ref 19–32)
Calcium: 9 mg/dL (ref 8.4–10.5)
Chloride: 89 meq/L — ABNORMAL LOW (ref 96–112)
Creatinine, Ser: 0.58 mg/dL (ref 0.40–1.20)
GFR: 82.84 mL/min (ref >60.00)
Glucose, Bld: 93 mg/dL (ref 70–99)
Potassium: 4.4 meq/L (ref 3.5–5.1)
Sodium: 124 meq/L — ABNORMAL LOW (ref 135–145)

## 2023-12-05 MED ORDER — SPIRONOLACTONE 25 MG PO TABS: 25.0000 mg | ORAL_TABLET | Freq: Every day | ORAL | 1 refills | Status: DC

## 2023-12-05 MED ORDER — AMLODIPINE BESYLATE 10 MG PO TABS: 10.0000 mg | ORAL_TABLET | Freq: Every day | ORAL | 1 refills | Status: AC

## 2023-12-05 MED ORDER — LOSARTAN POTASSIUM-HCTZ 100-12.5 MG PO TABS: 1.0000 | ORAL_TABLET | Freq: Every day | ORAL | 1 refills | Status: DC

## 2023-12-05 NOTE — Assessment & Plan Note (Addendum)
 Improved and controlled. Both readings in office at goal.  Home readings and office readings are all at goal and less than 140/90.   BMP pending.   Continue Spirolactone 25 mg once daily, rx sent; amlodipine  10 mg once daily rx sent; and losartan -hydrochlorothiazide  100 mg-12.5 mg once daily rx sent.  Discussed DASH diet. F/u in six months or sooner if needed.

## 2023-12-05 NOTE — Progress Notes (Signed)
 Established Patient Office Visit  Subjective   Patient ID: Barbara Murphy, female    DOB: 01-17-1941  Age: 83 y.o. MRN: [id number]  Chief Complaint  Patient presents with   Medical Management of Chronic Issues    F/U on Blood Pressure; doing much better, no concerns    HPI  Barbara Murphy is a 83 year old female, patient of Dr. Joelle Musca, with past medical history of HTN, aortive valve sclerosis, dry age-related macular degeneration presents today for a follow up on hypertension.   Hypertension: She was evaluated on 11/23/23 for hypertension, we added spironolactone  25 mg once daily to her daily regimen of losartan  100 mg once daily and hydrochlorothiazide  12.5 mg once daily.   Today she reports that she is doing much better. Her home readings range systolic 130s and diastolic 70s. She denies any chest pain, shortness of breath or difficulty breathing. She denies any ankle edema.   She's scheduled to have her eye surgery on June 5th.  Patient Active Problem List   Diagnosis Date Noted   Hyponatremia 06/16/2023   Collagenous colitis    Branch retinal artery occlusion of left eye 03/15/2023   Dry age-related macular degeneration 05/27/2022   Impingement syndrome of right shoulder 05/25/2021   Aortic valve sclerosis 05/22/2020   Preventative health care 04/28/2017   History of melanoma 03/23/2016   Advance directive discussed with patient 03/23/2016   Hypertension    Past Medical History:  Diagnosis Date   Actinic keratosis 04/14/2021   L shoulder, Lichenoid AK, needs LN2   Basal cell carcinoma 08/04/2008   Right forearm. Excised 08/04/2008.   Collagenous colitis    Dysplastic nevus 08/29/2013   Left lateral upper back. Moderate to severe atypia. Excised 11/14/2013   Hypertension    Melanoma (HCC)    Left distal posterior upper arm.   Melanoma in situ (HCC) 07/2018   Left lateral proximal upper arm. Bx: 07/04/2018   Melanoma in situ (HCC) 04/07/2011   Left posterior upper arm.  Excised 05/04/2011.   Melanoma in situ (HCC) 03/17/2008   Left superior lateral buccal cheek. Mohs, Dr. Debrah Fan   Past Surgical History:  Procedure Laterality Date   BREAST BIOPSY  1962 & 1980   benign   CARDIOVASCULAR STRESS TEST  01/2015   Nuclear---negative   DEXA  2005   normal   MELANOMA EXCISION     multiple--- [facility]   STRABISMUS SURGERY  1980   TONSILLECTOMY     Allergies  Allergen Reactions   Sulfa Antibiotics Hives   Other    Metoprolol  Other (See Comments)    Personality change, fatigue         12/05/2023   10:37 AM 11/23/2023   11:44 AM 11/08/2023    2:42 PM  Depression screen PHQ 2/9  Decreased Interest 0 0 0  Down, Depressed, Hopeless 0 0 0  PHQ - 2 Score 0 0 0  Altered sleeping 0 1 0  Tired, decreased energy 0 0 0  Change in appetite 0 0 0  Feeling bad or failure about yourself  0 0 0  Trouble concentrating 0 1 0  Moving slowly or fidgety/restless 0 0 0  Suicidal thoughts 0 0 0  PHQ-9 Score 0 2 0  Difficult doing work/chores Not difficult at all Not difficult at all Not difficult at all       12/05/2023   10:37 AM 11/23/2023   11:45 AM 11/08/2023    2:42 PM 10/26/2023  12:41 PM  GAD 7 : Generalized Anxiety Score  Nervous, Anxious, on Edge 0 1 0 1  Control/stop worrying 0 0 0 0  Worry too much - different things 0 0 0 0  Trouble relaxing 0 1 0 1  Restless 0 0 0 0  Easily annoyed or irritable 0 0 0 0  Afraid - awful might happen 0 0 0 0  Total GAD 7 Score 0 2 0 2  Anxiety Difficulty Not difficult at all Not difficult at all Not difficult at all Not difficult at all      Review of Systems  Constitutional:  Negative for chills and fever.  Respiratory:  Negative for shortness of breath.   Cardiovascular:  Negative for chest pain and leg swelling.  Gastrointestinal:  Negative for abdominal pain, constipation, diarrhea, heartburn, nausea and vomiting.  Genitourinary:  Negative for dysuria, frequency and urgency.  Neurological:   Negative for dizziness and headaches.  Endo/Heme/Allergies:  Negative for polydipsia.  Psychiatric/Behavioral:  Negative for depression and suicidal ideas. The patient is not nervous/anxious.       Objective:     BP 134/80 (BP Location: Left Arm, Patient Position: Sitting, Cuff Size: Normal)   Pulse 86   Temp 97.9 F (36.6 C) (Oral)   Ht 5' 3.75" (1.619 m)   Wt 168 lb (76.2 kg)   SpO2 96%   BMI 29.06 kg/m  BP Readings from Last 3 Encounters:  12/05/23 134/80  11/23/23 (!) 150/80  11/08/23 124/82   Wt Readings from Last 3 Encounters:  12/05/23 168 lb (76.2 kg)  11/23/23 167 lb (75.8 kg)  11/08/23 166 lb (75.3 kg)      Physical Exam Vitals and nursing note reviewed.  Constitutional:      Appearance: Normal appearance.  Cardiovascular:     Rate and Rhythm: Normal rate and regular rhythm.     Pulses: Normal pulses.     Heart sounds: Normal heart sounds.  Pulmonary:     Effort: Pulmonary effort is normal.     Breath sounds: Normal breath sounds.  Musculoskeletal:        General: No swelling.  Skin:    General: Skin is warm.  Neurological:     Mental Status: She is alert and oriented to person, place, and time.  Psychiatric:        Mood and Affect: Mood normal.        Behavior: Behavior normal.        Thought Content: Thought content normal.        Judgment: Judgment normal.      No results found for any visits on 12/05/23.     The ASCVD Risk score (Arnett DK, et al., 2019) failed to calculate for the following reasons:   The 2019 ASCVD risk score is only valid for ages 36 to 33    Assessment & Plan:  Primary hypertension Assessment & Plan: Improved and controlled. Both readings in office at goal.  Home readings and office readings are all at goal and less than 140/90.   BMP pending.   Continue Spirolactone 25 mg once daily, rx sent; amlodipine  10 mg once daily rx sent; and losartan -hydrochlorothiazide  100 mg-12.5 mg once daily rx sent.  Discussed  DASH diet. F/u in six months or sooner if needed.  Orders: -     Basic metabolic panel with GFR -     Losartan  Potassium-HCTZ; Take 1 tablet by mouth daily.  Dispense: 90 tablet; Refill: 1 -  amLODIPine  Besylate; Take 1 tablet (10 mg total) by mouth daily.  Dispense: 90 tablet; Refill: 1 -     Spironolactone ; Take 1 tablet (25 mg total) by mouth daily.  Dispense: 90 tablet; Refill: 1     Return in about 6 months (around 06/06/2024) for transfer of care and hypertension f/u.    Jolanda Nation, NP

## 2023-12-05 NOTE — Patient Instructions (Addendum)
 Stop by the lab prior to leaving today. I will notify you of your results once received.   Continue Spironolactone  25 mg once daily, hyzaar (losartan +hydrochlorothiazide  100-25 mg once daily, and amlodipine  10 mg once daily.  All refills sent to [facility] for 6 months.  Follow up in 6 months for official transfer of care and blood pressure.   It was a pleasure to see you today!

## 2023-12-06 ENCOUNTER — Ambulatory Visit: Payer: Self-pay | Admitting: General Practice

## 2023-12-21 ENCOUNTER — Other Ambulatory Visit: Payer: Self-pay | Admitting: General Practice

## 2023-12-21 DIAGNOSIS — E871 Hypo-osmolality and hyponatremia: Secondary | ICD-10-CM

## 2023-12-22 ENCOUNTER — Other Ambulatory Visit (INDEPENDENT_AMBULATORY_CARE_PROVIDER_SITE_OTHER)

## 2023-12-22 DIAGNOSIS — E871 Hypo-osmolality and hyponatremia: Secondary | ICD-10-CM | POA: Diagnosis not present

## 2023-12-22 LAB — BASIC METABOLIC PANEL WITH GFR
BUN: 23 mg/dL (ref 6–23)
CO2: 27 meq/L (ref 19–32)
Calcium: 9.5 mg/dL (ref 8.4–10.5)
Chloride: 89 meq/L — ABNORMAL LOW (ref 96–112)
Creatinine, Ser: 0.7 mg/dL (ref 0.40–1.20)
GFR: 79.14 mL/min (ref >60.00)
Glucose, Bld: 100 mg/dL — ABNORMAL HIGH (ref 70–99)
Potassium: 4.1 meq/L (ref 3.5–5.1)
Sodium: 125 meq/L — ABNORMAL LOW (ref 135–145)

## 2023-12-25 ENCOUNTER — Ambulatory Visit: Payer: Self-pay | Admitting: General Practice

## 2023-12-25 DIAGNOSIS — E871 Hypo-osmolality and hyponatremia: Secondary | ICD-10-CM

## 2023-12-28 DIAGNOSIS — H35371 Puckering of macula, right eye: Secondary | ICD-10-CM | POA: Diagnosis not present

## 2023-12-28 DIAGNOSIS — H26492 Other secondary cataract, left eye: Secondary | ICD-10-CM | POA: Diagnosis not present

## 2023-12-28 DIAGNOSIS — H353221 Exudative age-related macular degeneration, left eye, with active choroidal neovascularization: Secondary | ICD-10-CM | POA: Diagnosis not present

## 2023-12-28 DIAGNOSIS — H26491 Other secondary cataract, right eye: Secondary | ICD-10-CM | POA: Diagnosis not present

## 2023-12-28 DIAGNOSIS — H353211 Exudative age-related macular degeneration, right eye, with active choroidal neovascularization: Secondary | ICD-10-CM | POA: Diagnosis not present

## 2023-12-28 DIAGNOSIS — H35372 Puckering of macula, left eye: Secondary | ICD-10-CM | POA: Diagnosis not present

## 2024-01-05 DIAGNOSIS — H43822 Vitreomacular adhesion, left eye: Secondary | ICD-10-CM | POA: Diagnosis not present

## 2024-01-05 DIAGNOSIS — H35372 Puckering of macula, left eye: Secondary | ICD-10-CM | POA: Diagnosis not present

## 2024-01-05 DIAGNOSIS — Z9889 Other specified postprocedural states: Secondary | ICD-10-CM | POA: Diagnosis not present

## 2024-01-10 ENCOUNTER — Ambulatory Visit: Payer: Self-pay | Admitting: General Practice

## 2024-01-10 ENCOUNTER — Other Ambulatory Visit (INDEPENDENT_AMBULATORY_CARE_PROVIDER_SITE_OTHER)

## 2024-01-10 DIAGNOSIS — E871 Hypo-osmolality and hyponatremia: Secondary | ICD-10-CM | POA: Diagnosis not present

## 2024-01-10 LAB — BASIC METABOLIC PANEL WITH GFR
BUN: 25 mg/dL — ABNORMAL HIGH (ref 6–23)
CO2: 30 meq/L (ref 19–32)
Calcium: 9.5 mg/dL (ref 8.4–10.5)
Chloride: 94 meq/L — ABNORMAL LOW (ref 96–112)
Creatinine, Ser: 0.72 mg/dL (ref 0.40–1.20)
GFR: 76.48 mL/min (ref >60.00)
Glucose, Bld: 94 mg/dL (ref 70–99)
Potassium: 3.8 meq/L (ref 3.5–5.1)
Sodium: 129 meq/L — ABNORMAL LOW (ref 135–145)

## 2024-02-02 DIAGNOSIS — H43822 Vitreomacular adhesion, left eye: Secondary | ICD-10-CM | POA: Diagnosis not present

## 2024-02-02 DIAGNOSIS — Z9889 Other specified postprocedural states: Secondary | ICD-10-CM | POA: Diagnosis not present

## 2024-02-02 DIAGNOSIS — H35372 Puckering of macula, left eye: Secondary | ICD-10-CM | POA: Diagnosis not present

## 2024-04-29 ENCOUNTER — Other Ambulatory Visit: Payer: Self-pay | Admitting: General Practice

## 2024-04-29 DIAGNOSIS — I10 Essential (primary) hypertension: Secondary | ICD-10-CM

## 2024-06-03 ENCOUNTER — Encounter: Payer: Medicare PPO | Admitting: Family

## 2024-06-05 ENCOUNTER — Ambulatory Visit: Payer: Self-pay | Admitting: General Practice

## 2024-06-05 ENCOUNTER — Ambulatory Visit: Admitting: General Practice

## 2024-06-05 ENCOUNTER — Encounter: Payer: Self-pay | Admitting: General Practice

## 2024-06-05 VITALS — BP 130/80 | HR 75 | Temp 97.2°F | Ht 63.75 in | Wt 176.4 lb

## 2024-06-05 DIAGNOSIS — K52831 Collagenous colitis: Secondary | ICD-10-CM | POA: Diagnosis not present

## 2024-06-05 DIAGNOSIS — Z0001 Encounter for general adult medical examination with abnormal findings: Secondary | ICD-10-CM

## 2024-06-05 DIAGNOSIS — H353222 Exudative age-related macular degeneration, left eye, with inactive choroidal neovascularization: Secondary | ICD-10-CM

## 2024-06-05 DIAGNOSIS — Z7689 Persons encountering health services in other specified circumstances: Secondary | ICD-10-CM | POA: Diagnosis not present

## 2024-06-05 DIAGNOSIS — L853 Xerosis cutis: Secondary | ICD-10-CM

## 2024-06-05 DIAGNOSIS — I10 Essential (primary) hypertension: Secondary | ICD-10-CM

## 2024-06-05 DIAGNOSIS — Z0000 Encounter for general adult medical examination without abnormal findings: Secondary | ICD-10-CM

## 2024-06-05 DIAGNOSIS — H353232 Exudative age-related macular degeneration, bilateral, with inactive choroidal neovascularization: Secondary | ICD-10-CM

## 2024-06-05 DIAGNOSIS — M5441 Lumbago with sciatica, right side: Secondary | ICD-10-CM

## 2024-06-05 LAB — COMPREHENSIVE METABOLIC PANEL WITH GFR
ALT: 22 U/L (ref 0–35)
AST: 26 U/L (ref 0–37)
Albumin: 4.6 g/dL (ref 3.5–5.2)
Alkaline Phosphatase: 52 U/L (ref 39–117)
BUN: 16 mg/dL (ref 6–23)
CO2: 29 meq/L (ref 19–32)
Calcium: 9.2 mg/dL (ref 8.4–10.5)
Chloride: 92 meq/L — ABNORMAL LOW (ref 96–112)
Creatinine, Ser: 0.62 mg/dL (ref 0.40–1.20)
GFR: 81.23 mL/min (ref >60.00)
Glucose, Bld: 95 mg/dL (ref 70–99)
Potassium: 4.3 meq/L (ref 3.5–5.1)
Sodium: 129 meq/L — ABNORMAL LOW (ref 135–145)
Total Bilirubin: 1.2 mg/dL (ref 0.2–1.2)
Total Protein: 7.3 g/dL (ref 6.0–8.3)

## 2024-06-05 LAB — CBC
HCT: 38.4 % (ref 36.0–46.0)
Hemoglobin: 12.9 g/dL (ref 12.0–15.0)
MCHC: 33.5 g/dL (ref 30.0–36.0)
MCV: 85.1 fl (ref 78.0–100.0)
Platelets: 327 K/uL (ref 150.0–400.0)
RBC: 4.52 Mil/uL (ref 3.87–5.11)
RDW: 16.6 % — ABNORMAL HIGH (ref 11.5–15.5)
WBC: 5.8 K/uL (ref 4.0–10.5)

## 2024-06-05 LAB — LIPID PANEL
Cholesterol: 237 mg/dL — ABNORMAL HIGH (ref 0–200)
HDL: 97.7 mg/dL (ref >39.00)
LDL Cholesterol: 131 mg/dL — ABNORMAL HIGH (ref 0–99)
NonHDL: 139.4
Total CHOL/HDL Ratio: 2
Triglycerides: 41 mg/dL (ref 0.0–149.0)
VLDL: 8.2 mg/dL (ref 0.0–40.0)

## 2024-06-05 LAB — TSH: TSH: 1.61 u[IU]/mL (ref 0.35–5.50)

## 2024-06-05 LAB — VITAMIN D 25 HYDROXY (VIT D DEFICIENCY, FRACTURES): VITD: 59.83 ng/mL (ref 30.00–100.00)

## 2024-06-05 NOTE — Assessment & Plan Note (Signed)
 Improved and controlled.  Home readings within goal and less than 140/90.  Continue Spirolactone 25 mg once daily, amlodipine  10 mg once daily, losartan -hydrochlorothiazide  100-12.5 mg once daily.   Discussed DASH diet.   F/u 6 months.

## 2024-06-05 NOTE — Assessment & Plan Note (Addendum)
 Immunizations UTD. Plans to covid vaccine. Dexa scan- declines  Discussed the importance of a healthy diet and regular exercise in order for weight loss, and to reduce the risk of further co-morbidity.  Exam stable. Labs pending.  Follow up in 1 year for repeat physical.

## 2024-06-05 NOTE — Patient Instructions (Signed)
 Stop by the lab prior to leaving today. I will notify you of your results once received.   Continue medications as prescribed.   Let me know if you change your bone density scan.   Let me know when you get the covid shot.   Follow up in one year.   It was a pleasure to see you today!

## 2024-06-05 NOTE — Assessment & Plan Note (Signed)
Vision stable Taking AREDs daily

## 2024-06-05 NOTE — Assessment & Plan Note (Signed)
 EMR reviewed briefly.

## 2024-06-05 NOTE — Assessment & Plan Note (Signed)
 No recent flares.  Doing well with diet. Uses probiotic as needed.

## 2024-06-05 NOTE — Progress Notes (Signed)
 New Patient Office Visit  Subjective    Patient ID: Josceline Chenard, female    DOB: 1941-03-25  Age: 83 y.o. MRN: [id number]  CC:  Chief Complaint  Patient presents with   New Patient (Initial Visit)    TOC from Dr. Jimmy    HPI Tifanie Gardiner is a 83 y.o. female presents to establish care, for complete physical and follow up of chronic conditions. Previous PCP- Dr. Jimmy   Discussed the use of AI scribe software for clinical note transcription with the patient, who gave verbal consent to proceed.  History of Present Illness She manages her hypertension with losartan  with HCTZ taken in the morning, and spironolactone  and amlodipine  taken later in the day. Her home blood pressure readings range from 120 to 140 mmHg. She denies any blurred vision, chest pain, shortness of breath or difficulty breathing.  She has been experiencing right-sided sciatica for three to four weeks, with pain primarily in her hip that sometimes radiates down to her ankle. She uses a cane and a walker at home depending on the severity of her symptoms and takes ibuprofen for pain management, usually two in the morning. She also uses heat and stretching exercises to manage her symptoms. She would like a handicap placcard.   She has a history of dry skin, which she has noticed on her shins, forearms, and elbows. The dry skin affects her shins, forearms, and elbows. She uses lotions like Suave and is considering other options for better management.  She has a history of macular degeneration, which is stable, and she continues to use AREDS supplements. She visits the eye doctor at least once a year.   She also has a history of colitis, which she manages with dietary modifications including probiotics and fermented foods.  She is concerned about her vitamin D  levels and wishes to have them checked.    Immunizations: -Tetanus: Completed in 2017. -Influenza: completed this season. -Shingles: Completed Shingrix  series -Pneumonia: Completed   Diet: Fair diet.  Exercise: No regular exercise.  Eye exam: Completes annually  Dental exam: Completes semi-annually    Bone Density Scan: Completed in 2005; declines today.    Outpatient Encounter Medications as of 06/05/2024  Medication Sig   Alpha-Lipoic Acid 200 MG CAPS Take 1 capsule by mouth daily.   amLODipine  (NORVASC ) 10 MG tablet Take 1 tablet (10 mg total) by mouth daily.   b complex vitamins tablet Take 1 tablet by mouth daily.   Coenzyme Q10 200 MG capsule Take 200 mg by mouth daily.   losartan -hydrochlorothiazide  (HYZAAR) 100-12.5 MG tablet TAKE 1 TABLET EVERY DAY   Magnesium 200 MG TABS Take 1 tablet by mouth daily.   Multiple Vitamins-Minerals (PRESERVISION AREDS 2 PO) Take by mouth.   NIACINAMIDE PO Take 1 each by mouth daily.   Omega-3 Fatty Acids (FISH OIL) 1000 MG CAPS Take 1 capsule by mouth daily.   spironolactone  (ALDACTONE ) 25 MG tablet TAKE 1 TABLET EVERY DAY (Patient taking differently: Take 12.5 mg by mouth daily.)   Turmeric 500 MG CAPS Take by mouth.   Vitamin D , Cholecalciferol, 1000 units CAPS Take 2 capsules by mouth.   [DISCONTINUED] losartan  (COZAAR ) 50 MG tablet Take 100 mg by mouth daily. (Patient not taking: Reported on 06/05/2024)   No facility-administered encounter medications on file as of 06/05/2024.    Past Medical History:  Diagnosis Date   Actinic keratosis 04/14/2021   L shoulder, Lichenoid AK, needs LN2   Basal cell carcinoma 08/04/2008  Right forearm. Excised 08/04/2008.   Collagenous colitis    Dysplastic nevus 08/29/2013   Left lateral upper back. Moderate to severe atypia. Excised 11/14/2013   Hypertension    Melanoma (HCC)    Left distal posterior upper arm.   Melanoma in situ (HCC) 07/2018   Left lateral proximal upper arm. Bx: 07/04/2018   Melanoma in situ (HCC) 04/07/2011   Left posterior upper arm. Excised 05/04/2011.   Melanoma in situ (HCC) 03/17/2008   Left superior lateral buccal  cheek. Mohs, Dr. Bluford    Past Surgical History:  Procedure Laterality Date   BREAST BIOPSY  1962 & 1980   benign   CARDIOVASCULAR STRESS TEST  01/2015   Nuclear---negative   DEXA  2005   normal   MELANOMA EXCISION     multiple--- [facility]   STRABISMUS SURGERY  1980   TONSILLECTOMY      Family History  Problem Relation Age of Onset   Cancer Mother        liver   Parkinson's disease Father    Stroke Sister    Heart disease Sister    Diabetes Brother    Heart disease Brother    Cancer Son        Prostate    Social History   Socioeconomic History   Marital status: Widowed    Spouse name: Not on file   Number of children: 7   Years of education: Not on file   Highest education level: Not on file  Occupational History   Occupation: Runner, Broadcasting/film/video-- [employer]    Comment: Retired  Tobacco Use   Smoking status: Former    Current packs/day: 0.00    Types: Cigarettes    Quit date: 07/25/1968    Years since quitting: 55.9    Passive exposure: Never   Smokeless tobacco: Never  Substance and Sexual Activity   Alcohol use: Not on file   Drug use: Not on file   Sexual activity: Not on file  Other Topics Concern   Not on file  Social History Narrative   Second marriage-- 20. Widowed 2020   2 children, 5 stepchildren   Multiple grandchildren and great grandchildren      Has living will   Son [place]ford has health care POA   Would accept resuscitation attempts   Would leave tube feeds up to son   Social Drivers of Corporate Investment Banker Strain: Not on file  Food Insecurity: Not on file  Transportation Needs: Not on file  Physical Activity: Not on file  Stress: Not on file  Social Connections: Not on file  Intimate Partner Violence: Not on file    Review of Systems  Constitutional:  Negative for chills and fever.  Respiratory:  Negative for shortness of breath.   Cardiovascular:  Negative for chest pain.  Gastrointestinal:  Negative for  abdominal pain, constipation, diarrhea, heartburn, nausea and vomiting.  Genitourinary:  Negative for dysuria, frequency and urgency.  Neurological:  Negative for dizziness and headaches.  Endo/Heme/Allergies:  Negative for polydipsia.  Psychiatric/Behavioral:  Negative for depression and suicidal ideas. The patient is not nervous/anxious.         Objective    BP 130/80   Pulse 75   Temp (!) 97.2 F (36.2 C) (Temporal)   Ht 5' 3.75 (1.619 m)   Wt 176 lb 6.4 oz (80 kg)   SpO2 99%   BMI 30.52 kg/m   Physical Exam Vitals and nursing note reviewed.  Constitutional:      Appearance: Normal appearance.  Cardiovascular:     Rate and Rhythm: Normal rate and regular rhythm.     Pulses: Normal pulses.     Heart sounds: Normal heart sounds.  Pulmonary:     Effort: Pulmonary effort is normal.     Breath sounds: Normal breath sounds.  Neurological:     Mental Status: She is alert and oriented to person, place, and time.  Psychiatric:        Mood and Affect: Mood normal.        Behavior: Behavior normal.        Thought Content: Thought content normal.        Judgment: Judgment normal.         Assessment & Plan:  Annual visit for general adult medical examination with abnormal findings Assessment & Plan: Immunizations UTD. Plans to covid vaccine. Dexa scan- declines  Discussed the importance of a healthy diet and regular exercise in order for weight loss, and to reduce the risk of further co-morbidity.  Exam stable. Labs pending.  Follow up in 1 year for repeat physical.    Establishing care with new doctor, encounter for Assessment & Plan: EMR reviewed briefly.     Primary hypertension Assessment & Plan: Improved and controlled.  Home readings within goal and less than 140/90.  Continue Spirolactone 25 mg once daily, amlodipine  10 mg once daily, losartan -hydrochlorothiazide  100-12.5 mg once daily.   Discussed DASH diet.   F/u 6 months.   Orders: -      CBC  Acute right-sided low back pain with right-sided sciatica  Collagenous colitis Assessment & Plan: No recent flares.  Doing well with diet. Uses probiotic as needed.   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   Taking AREDs daily.   Preventative health care Assessment & Plan: Immunizations UTD. Plans to covid vaccine. Dexa scan- declines  Discussed the importance of a healthy diet and regular exercise in order for weight loss, and to reduce the risk of further co-morbidity.  Exam stable. Labs pending.  Follow up in 1 year for repeat physical.   Orders: -     VITAMIN D  25 Hydroxy (Vit-D Deficiency, Fractures) -     CBC -     Lipid panel -     Comprehensive metabolic panel with GFR -     TSH  Exudative age-related macular degeneration, left eye, with inactive choroidal neovascularization (HCC)  Dry skin  Assessment and Plan Assessment & Plan Essential hypertension Blood pressure at home ranges from 120 to 140 mmHg. Current regimen includes losartan , hydrochlorothiazide , spironolactone , and amlodipine . Discussed medication timing to avoid hypotension. - Continue current antihypertensive regimen. - Monitor blood pressure at home. - Follow up if blood pressure readings become elevated.  Lumbago with right-sided sciatica Intermittent right-sided back pain with sciatica for 3-4 weeks. Uses ibuprofen and home exercises. Discussed ibuprofen's kidney effects and physical therapy if symptoms persist. - Provided printed exercises for sciatica. - Recommended physical therapy if symptoms persist. - Continue ibuprofen as needed, monitor for kidney effects.  Age-related macular degeneration, bilateral nonexudative and left eye inactive exudative Bilateral nonexudative macular degeneration with left eye inactive exudative. - Continue ARADS monitoring. - Continue regular  ophthalmology follow-ups.  Dry skin (xerosis cutis) Dry skin likely due to diuretic use. Discussed moisturizers.  - Use moisturizers like Eucerin, CeraVe, or Cetaphil. - Consider coconut oil for dry skin.  Return in about 1 year (around 06/05/2025) for physical and fasting labs.SABRA Carrol Aurora, NP

## 2024-06-07 ENCOUNTER — Encounter: Payer: Self-pay | Admitting: General Practice

## 2024-06-10 ENCOUNTER — Ambulatory Visit: Payer: Medicare PPO | Admitting: Dermatology

## 2024-06-10 ENCOUNTER — Encounter: Payer: Self-pay | Admitting: Dermatology

## 2024-06-10 DIAGNOSIS — L578 Other skin changes due to chronic exposure to nonionizing radiation: Secondary | ICD-10-CM

## 2024-06-10 DIAGNOSIS — L814 Other melanin hyperpigmentation: Secondary | ICD-10-CM | POA: Diagnosis not present

## 2024-06-10 DIAGNOSIS — W908XXA Exposure to other nonionizing radiation, initial encounter: Secondary | ICD-10-CM

## 2024-06-10 DIAGNOSIS — D229 Melanocytic nevi, unspecified: Secondary | ICD-10-CM

## 2024-06-10 DIAGNOSIS — L821 Other seborrheic keratosis: Secondary | ICD-10-CM

## 2024-06-10 DIAGNOSIS — Z1283 Encounter for screening for malignant neoplasm of skin: Secondary | ICD-10-CM | POA: Diagnosis not present

## 2024-06-10 DIAGNOSIS — Z86018 Personal history of other benign neoplasm: Secondary | ICD-10-CM

## 2024-06-10 DIAGNOSIS — Z808 Family history of malignant neoplasm of other organs or systems: Secondary | ICD-10-CM

## 2024-06-10 DIAGNOSIS — Z85820 Personal history of malignant melanoma of skin: Secondary | ICD-10-CM

## 2024-06-10 DIAGNOSIS — Z86006 Personal history of melanoma in-situ: Secondary | ICD-10-CM

## 2024-06-10 DIAGNOSIS — D692 Other nonthrombocytopenic purpura: Secondary | ICD-10-CM

## 2024-06-10 DIAGNOSIS — D1801 Hemangioma of skin and subcutaneous tissue: Secondary | ICD-10-CM

## 2024-06-10 DIAGNOSIS — L219 Seborrheic dermatitis, unspecified: Secondary | ICD-10-CM

## 2024-06-10 DIAGNOSIS — Z85828 Personal history of other malignant neoplasm of skin: Secondary | ICD-10-CM

## 2024-06-10 NOTE — Progress Notes (Signed)
 Follow-Up Visit   Subjective  Barbara Murphy is a 83 y.o. female who presents for the following: Skin Cancer Screening and Full Body Skin Exam hx of Melanoma, melanoma IS, BCC, Dysplastic Nevus, AKs  The patient presents for Total-Body Skin Exam (TBSE) for skin cancer screening and mole check. The patient has spots, moles and lesions to be evaluated, some may be new or changing and the patient may have concern these could be cancer.    The following portions of the chart were reviewed this encounter and updated as appropriate: medications, allergies, medical history  Review of Systems:  No other skin or systemic complaints except as noted in HPI or Assessment and Plan.  Objective  Well appearing patient in no apparent distress; mood and affect are within normal limits.  A full examination was performed including scalp, head, eyes, ears, nose, lips, neck, chest, axillae, abdomen, back, buttocks, bilateral upper extremities, bilateral lower extremities, hands, feet, fingers, toes, fingernails, and toenails. All findings within normal limits unless otherwise noted below.   Relevant physical exam findings are noted in the Assessment and Plan.    Assessment & Plan   SKIN CANCER SCREENING PERFORMED TODAY.  ACTINIC DAMAGE - Chronic condition, secondary to cumulative UV/sun exposure - diffuse scaly erythematous macules with underlying dyspigmentation - Recommend daily broad spectrum sunscreen SPF 30+ to sun-exposed areas, reapply every 2 hours as needed.  - Staying in the shade or wearing long sleeves, sun glasses (UVA+UVB protection) and wide brim hats (4-inch brim around the entire circumference of the hat) are also recommended for sun protection.  - Call for new or changing lesions.  LENTIGINES, SEBORRHEIC KERATOSES, HEMANGIOMAS - Benign normal skin lesions - SK L flank- waxy stuck on papules x 2 - Benign-appearing - Call for any changes  MELANOCYTIC NEVI - Tan-brown and/or  pink-flesh-colored symmetric macules and papules - Benign appearing on exam today - Observation - Call clinic for new or changing moles - Recommend daily use of broad spectrum spf 30+ sunscreen to sun-exposed areas.   HISTORY OF MELANOMA IN SITU - Lentigo maligna type Left lateral proximal upper arm. Excised 07/2018 [facility]. Left superior lateral buccal cheek. Mohs 03/17/2008. Left posterior upper arm. Bx 04/07/2011, excised 05/04/2011. - No evidence of recurrence today - Recommend regular full body skin exams - Recommend daily broad spectrum sunscreen SPF 30+ to sun-exposed areas, reapply every 2 hours as needed.  - Call if any new or changing lesions are noted between office visits   HISTORY OF MELANOMA - No evidence of recurrence today- Left distal posterior upper arm.  - Recommend regular full body skin exams - Recommend daily broad spectrum sunscreen SPF 30+ to sun-exposed areas, reapply every 2 hours as needed.  - Call if any new or changing lesions are noted between office visits    HISTORY OF BASAL CELL CARCINOMA OF THE SKIN. - No evidence of recurrence today- Right forearm. Excised 08/04/2008  - Recommend regular full body skin exams - Recommend daily broad spectrum sunscreen SPF 30+ to sun-exposed areas, reapply every 2 hours as needed.  - Call if any new or changing lesions are noted between office visits    HISTORY OF DYSPLASTIC NEVUS - Left lateral upper back. Mod-severe atypia. Bx: 08/29/2013. Excised 11/14/2013. - No evidence of recurrence today - Recommend regular full body skin exams - Recommend daily broad spectrum sunscreen SPF 30+ to sun-exposed areas, reapply every 2 hours as needed.  - Call if any new or changing lesions  are noted between office visits   FAMILY HISTORY OF SKIN CANCER What type(s): Melanoma Who affected: Brother and sister.    Purpura - Chronic; persistent and recurrent.  Treatable, but not curable. - Violaceous macules and  patches - Benign - Related to trauma, age, sun damage and/or use of blood thinners, chronic use of topical and/or oral steroids - Observe - Can use OTC arnica containing moisturizer such as Dermend Bruise Formula if desired - Call for worsening or other concerns  SEBORRHEIC DERMATITIS scalp Exam: scalp with mild scaling  Chronic and persistent condition with duration or expected duration over one year. Condition is symptomatic/ bothersome to patient. Not currently at goal.  Seborrheic Dermatitis is a chronic persistent rash characterized by pinkness and scaling most commonly of the mid face but also can occur on the scalp (dandruff), ears; mid chest, mid back and groin.  It tends to be exacerbated by stress and cooler weather.  People who have neurologic disease may experience new onset or exacerbation of existing seborrheic dermatitis.  The condition is not curable but treatable and can be controlled.  Treatment Plan: Recommend otc medicated shampoo with zinc or selenium sulfide  OTC Head & Shoulders shampoo 2-3x per week, massage into scalp and let sit 3-5 minutes before rinsing.   Return in about 1 year (around 06/10/2025) for TBSE, Hx of Melanoma, Hx of Melanoma IS, Hx of BCC, Hx of Dysplastic nevi, Hx of AKs.  I, Grayce Saunas, RMA, am acting as scribe for Rexene Rattler, MD .   Documentation: I have reviewed the above documentation for accuracy and completeness, and I agree with the above.  Rexene Rattler, MD

## 2024-06-10 NOTE — Patient Instructions (Addendum)
 Recommend over the counter medicated shampoo with zinc or selenium sulfide to wash scalp  Recommend daily broad spectrum sunscreen SPF 30+ to sun-exposed areas, reapply every 2 hours as needed. Call for new or changing lesions.  Staying in the shade or wearing long sleeves, sun glasses (UVA+UVB protection) and wide brim hats (4-inch brim around the entire circumference of the hat) are also recommended for sun protection.    Due to recent changes in healthcare laws, you may see results of your pathology and/or laboratory studies on MyChart before the doctors have had a chance to review them. We understand that in some cases there may be results that are confusing or concerning to you. Please understand that not all results are received at the same time and often the doctors may need to interpret multiple results in order to provide you with the best plan of care or course of treatment. Therefore, we ask that you please give us  2 business days to thoroughly review all your results before contacting the office for clarification. Should we see a critical lab result, you will be contacted sooner.   If You Need Anything After Your Visit  If you have any questions or concerns for your doctor, please call our main line at [phone] and press option 4 to reach your doctor's medical assistant. If no one answers, please leave a voicemail as directed and we will return your call as soon as possible. Messages left after 4 pm will be answered the following business day.   You may also send us  a message via MyChart. We typically respond to MyChart messages within 1-2 business days.  For prescription refills, please ask your pharmacy to contact our office. Our fax number is [phone].  If you have an urgent issue when the clinic is closed that cannot wait until the next business day, you can page your doctor at the number below.    Please note that while we do our best to be available for urgent issues  outside of office hours, we are not available 24/7.   If you have an urgent issue and are unable to reach us , you may choose to seek medical care at your doctor's office, retail clinic, urgent care center, or emergency room.  If you have a medical emergency, please immediately call 911 or go to the emergency department.  Pager Numbers  - Dr. Hester: [phone]  - Dr. Jackquline: [phone]  - Dr. Claudene: [phone]   - Dr. Raymund: [phone]  In the event of inclement weather, please call our main line at [phone] for an update on the status of any delays or closures.  Dermatology Medication Tips: Please keep the boxes that topical medications come in in order to help keep track of the instructions about where and how to use these. Pharmacies typically print the medication instructions only on the boxes and not directly on the medication tubes.   If your medication is too expensive, please contact our office at [phone] option 4 or send us  a message through MyChart.   We are unable to tell what your co-pay for medications will be in advance as this is different depending on your insurance coverage. However, we may be able to find a substitute medication at lower cost or fill out paperwork to get insurance to cover a needed medication.   If a prior authorization is required to get your medication covered by your insurance company, please allow us  1-2 business days to complete this process.  Drug  prices often vary depending on where the prescription is filled and some pharmacies may offer cheaper prices.  The website www.goodrx.com contains coupons for medications through different pharmacies. The prices here do not account for what the cost may be with help from insurance (it may be cheaper with your insurance), but the website can give you the price if you did not use any insurance.  - You can print the associated coupon and take it with your prescription to the pharmacy.   - You may also stop by our office during regular business hours and pick up a GoodRx coupon card.  - If you need your prescription sent electronically to a different pharmacy, notify our office through [facility] or by phone at [phone] option 4.     Si Usted Necesita Algo Despus de Su Visita  Tambin puede enviarnos un mensaje a travs de Clinical Cytogeneticist. Por lo general respondemos a los mensajes de MyChart en el transcurso de 1 a 2 das hbiles.  Para renovar recetas, por favor pida a su farmacia que se ponga en contacto con nuestra oficina. Randi lakes de fax es [place] [phone].  Si tiene un asunto urgente cuando la clnica est cerrada y que no puede esperar hasta el siguiente da hbil, puede llamar/localizar a su doctor(a) al nmero que aparece a continuacin.   Por favor, tenga en cuenta que aunque hacemos todo lo posible para estar disponibles para asuntos urgentes fuera del horario de [place], no estamos disponibles las 24 horas del da, los 7 [address] de la [place].   Si tiene un problema urgente y no puede comunicarse con nosotros, puede optar por buscar atencin mdica  en el consultorio de su doctor(a), en una clnica privada, en un centro de atencin urgente o en una sala de emergencias.  Si tiene engineer, drilling, por favor llame inmediatamente al 911 o vaya a la sala de emergencias.  Nmeros de bper  - Dr. Hester: [phone]  - Dra. Jackquline: [phone]  - Dr. Claudene: [phone]  - Dra. Kitts: [phone]  En caso de inclemencias del [place], por favor llame a nuestra lnea principal al [phone] para una actualizacin sobre el estado de cualquier retraso o cierre.  Consejos para la medicacin en dermatologa: Por favor, guarde las cajas en las que vienen los medicamentos de uso tpico para ayudarle a seguir las instrucciones sobre dnde y cmo usarlos. Las farmacias generalmente imprimen las instrucciones del medicamento slo en las cajas y no  directamente en los tubos del [place].   Si su medicamento es muy caro, por favor, pngase en contacto con landry rieger llamando al [phone] y presione la opcin 4 o envenos un mensaje a travs de Clinical Cytogeneticist.   No podemos decirle cul ser su copago por los medicamentos por adelantado ya que esto es diferente dependiendo de la cobertura de su seguro. Sin embargo, es posible que podamos encontrar un medicamento sustituto a audiological scientist un formulario para que el seguro cubra el medicamento que se considera necesario.   Si se requiere una autorizacin previa para que su compaa de seguros cubra su medicamento, por favor permtanos de 1 a 2 das hbiles para completar este proceso.  Los precios de los medicamentos varan con frecuencia dependiendo del environmental consultant de dnde se surte la receta y alguna farmacias pueden ofrecer precios ms baratos.  El sitio web www.goodrx.com tiene cupones para medicamentos de health and safety inspector. Los precios aqu no tienen en cuenta lo que podra costar con la ayuda del  seguro (puede ser ms barato con su seguro), pero el sitio web puede darle el precio si no visual merchandiser.  - Puede imprimir el cupn correspondiente y llevarlo con su receta a la farmacia.  - Tambin puede pasar por nuestra oficina durante el horario de atencin regular y education officer, museum una tarjeta de cupones de GoodRx.  - Si necesita que su receta se enve electrnicamente a una farmacia diferente, informe a nuestra oficina a travs de MyChart de [place] o por telfono llamando al [phone] y presione la opcin 4.

## 2024-06-24 ENCOUNTER — Ambulatory Visit

## 2024-06-24 VITALS — BP 130/80 | Ht 63.5 in | Wt 176.0 lb

## 2024-06-24 DIAGNOSIS — Z0000 Encounter for general adult medical examination without abnormal findings: Secondary | ICD-10-CM

## 2024-06-24 NOTE — Patient Instructions (Signed)
 Ms. Arduini,  Thank you for taking the time for your Medicare Wellness Visit. I appreciate your continued commitment to your health goals. Please review the care plan we discussed, and feel free to reach out if I can assist you further.  Please note that Annual Wellness Visits do not include a physical exam. Some assessments may be limited, especially if the visit was conducted virtually. If needed, we may recommend an in-person follow-up with your provider.  Ongoing Care Seeing your primary care provider every 3 to 6 months helps us  monitor your health and provide consistent, personalized care.   Referrals If a referral was made during today's visit and you haven't received any updates within two weeks, please contact the referred provider directly to check on the status.  Recommended Screenings:  Health Maintenance  Topic Date Due   Medicare Annual Wellness Visit  05/30/2024   COVID-19 Vaccine (9 - 2025-26 season) 06/20/2026*   DTaP/Tdap/Td vaccine (3 - Td or Tdap) 03/23/2026   Pneumococcal Vaccine for age over 41  Completed   Flu Shot  Completed   Osteoporosis screening with Bone Density Scan  Completed   Zoster (Shingles) Vaccine  Completed   Meningitis B Vaccine  Aged Out  *Topic was postponed. The date shown is not the original due date.       03/23/2016    1:01 PM  Advanced Directives  Does Patient Have a Medical Advance Directive? Yes   Type of Estate Agent of [place];Living will   Does patient want to make changes to medical advance directive? No - Patient declined   Copy of Healthcare Power of Attorney in Chart? No - copy requested      Data saved with a previous flowsheet row definition    Vision: Annual vision screenings are recommended for early detection of glaucoma, cataracts, and diabetic retinopathy. These exams can also reveal signs of chronic conditions such as diabetes and high blood pressure.  Dental: Annual dental screenings help  detect early signs of oral cancer, gum disease, and other conditions linked to overall health, including heart disease and diabetes.  Please see the attached documents for additional preventive care recommendations.

## 2024-06-24 NOTE — Progress Notes (Signed)
 I connected with  Barbara Murphy on 06/24/24 by a audio enabled telemedicine application and verified that I am speaking with the correct person using two identifiers.  Patient Location: Home  Provider Location: Home Office  Persons Participating in Visit: Patient.  I discussed the limitations of evaluation and management by telemedicine. The patient expressed understanding and agreed to proceed.  Vital Signs: Because this visit was a virtual/telehealth visit, some criteria may be missing or patient reported. Any vitals not documented were not able to be obtained and vitals that have been documented are patient reported.   Because this visit was a virtual/telehealth visit,  certain criteria was not obtained, such a blood pressure, CBG if applicable, and timed get up and go. Any medications not marked as taking were not mentioned during the medication reconciliation part of the visit. Any vitals not documented were not able to be obtained due to this being a telehealth visit or patient was unable to self-report a recent blood pressure reading due to a lack of equipment at home via telehealth. Vitals that have been documented are verbally provided by the patient.   This visit was performed by a medical professional under my direct supervision. I was immediately available for consultation/collaboration. I have reviewed and agree with the Annual Wellness Visit documentation.  Chief Complaint  Patient presents with   Medicare Wellness     Subjective:   Barbara Murphy is a 83 y.o. female who presents for a Medicare Annual Wellness Visit.  Visit info / Clinical Intake: Medicare Wellness Visit Type:: Subsequent Annual Wellness Visit Persons participating in visit and providing information:: patient Medicare Wellness Visit Mode:: Telephone If telephone:: video declined Since this visit was completed virtually, some vitals may be partially provided or unavailable. Missing vitals are due to the  limitations of the virtual format.: Documented vitals are patient reported If Telephone or Video please confirm:: I connected with patient using audio/video enable telemedicine. I verified patient identity with two identifiers, discussed telehealth limitations, and patient agreed to proceed. Patient Location:: home Provider Location:: home office Interpreter Needed?: No Pre-visit prep was completed: yes AWV questionnaire completed by patient prior to visit?: no Living arrangements:: (!) lives alone Patient's Overall Health Status Rating: very good Typical amount of pain: none Does pain affect daily life?: no Are you currently prescribed opioids?: no  Dietary Habits and Nutritional Risks How many meals a day?: (!) 0 Eats fruit and vegetables daily?: yes Most meals are obtained by: preparing own meals In the last 2 weeks, have you had any of the following?: none Diabetic:: no  Functional Status Activities of Daily Living (to include ambulation/medication): Independent Ambulation: Independent Medication Administration: Independent Home Management (perform basic housework or laundry): Independent Manage your own finances?: yes Primary transportation is: driving Concerns about vision?: no *vision screening is required for WTM* Concerns about hearing?: no  Fall Screening Falls in the past year?: 0 Number of falls in past year: 0 Was there an injury with Fall?: 0 Fall Risk Category Calculator: 0 Patient Fall Risk Level: Low Fall Risk  Fall Risk Patient at Risk for Falls Due to: No Fall Risks Fall risk Follow up: Falls evaluation completed; Falls prevention discussed  Home and Transportation Safety: All rugs have non-skid backing?: N/A, no rugs All stairs or steps have railings?: N/A, no stairs Grab bars in the bathtub or shower?: yes Have non-skid surface in bathtub or shower?: yes Good home lighting?: yes Regular seat belt use?: (!) no Hospital stays in the last  year::  no  Cognitive Assessment Difficulty concentrating, remembering, or making decisions? : no Will 6CIT or Mini Cog be Completed: no 6CIT or Mini Cog Declined: patient alert, oriented, able to answer questions appropriately and recall recent events  Advance Directives (For Healthcare) Does Patient Have a Medical Advance Directive?: Yes Does patient want to make changes to medical advance directive?: No - Patient declined Type of Advance Directive: Healthcare Power of [place]; Living will Copy of Healthcare Power of Attorney in Chart?: Yes - validated most recent copy scanned in chart (See row information) Copy of Living Will in Chart?: Yes - validated most recent copy scanned in chart (See row information)  Reviewed/Updated  Reviewed/Updated: Reviewed All (Medical, Surgical, Family, Medications, Allergies, Care Teams, Patient Goals)    Allergies (verified) Sulfa antibiotics, Other, and Metoprolol    Current Medications (verified) Outpatient Encounter Medications as of 06/24/2024  Medication Sig   Alpha-Lipoic Acid 200 MG CAPS Take 1 capsule by mouth daily.   amLODipine  (NORVASC ) 10 MG tablet Take 1 tablet (10 mg total) by mouth daily.   b complex vitamins tablet Take 1 tablet by mouth daily.   Coenzyme Q10 200 MG capsule Take 200 mg by mouth daily.   losartan -hydrochlorothiazide  (HYZAAR) 100-12.5 MG tablet TAKE 1 TABLET EVERY DAY   Magnesium 200 MG TABS Take 1 tablet by mouth daily.   Multiple Vitamins-Minerals (PRESERVISION AREDS 2 PO) Take by mouth.   NIACINAMIDE PO Take 1 each by mouth daily.   Omega-3 Fatty Acids (FISH OIL) 1000 MG CAPS Take 1 capsule by mouth daily.   spironolactone  (ALDACTONE ) 25 MG tablet TAKE 1 TABLET EVERY DAY (Patient taking differently: Take 12.5 mg by mouth daily.)   Turmeric 500 MG CAPS Take by mouth.   Vitamin D , Cholecalciferol, 1000 units CAPS Take 2 capsules by mouth.   No facility-administered encounter medications on file as of 06/24/2024.     History: Past Medical History:  Diagnosis Date   Actinic keratosis 04/14/2021   L shoulder, Lichenoid AK, needs LN2   Basal cell carcinoma 08/04/2008   Right forearm. Excised 08/04/2008.   Collagenous colitis    Dysplastic nevus 08/29/2013   Left lateral upper back. Moderate to severe atypia. Excised 11/14/2013   Hypertension    Melanoma (HCC)    Left distal posterior upper arm.   Melanoma in situ (HCC) 07/2018   Left lateral proximal upper arm. Bx: 07/04/2018   Melanoma in situ (HCC) 04/07/2011   Left posterior upper arm. Excised 05/04/2011.   Melanoma in situ (HCC) 03/17/2008   Left superior lateral buccal cheek. Mohs, Dr. Bluford   Past Surgical History:  Procedure Laterality Date   BREAST BIOPSY  1962 & 1980   benign   CARDIOVASCULAR STRESS TEST  01/2015   Nuclear---negative   DEXA  2005   normal   MELANOMA EXCISION     multiple--- [facility]   STRABISMUS SURGERY  1980   TONSILLECTOMY     Family History  Problem Relation Age of Onset   Cancer Mother        liver   Parkinson's disease Father    Stroke Sister    Atrial fibrillation Sister    Stroke Sister    Heart disease Sister    Diabetes Brother    Heart disease Brother    Cancer Son        Prostate   Social History   Occupational History   Occupation: Runner, Broadcasting/film/video-- [employer]    Comment: Retired  Tobacco Use  Smoking status: Former    Current packs/day: 0.00    Types: Cigarettes    Quit date: 07/25/1968    Years since quitting: 55.9    Passive exposure: Never   Smokeless tobacco: Never  Substance and Sexual Activity   Alcohol use: Not on file   Drug use: Not on file   Sexual activity: Not on file   Tobacco Counseling Counseling given: Not Answered  SDOH Screenings   Food Insecurity: No Food Insecurity (06/24/2024)  Housing: Low Risk  (06/24/2024)  Transportation Needs: No Transportation Needs (06/24/2024)  Utilities: Not At Risk (06/24/2024)  Depression (PHQ2-9): Low Risk   (06/24/2024)  Physical Activity: Insufficiently Active (06/24/2024)  Social Connections: Moderately Integrated (06/24/2024)  Stress: No Stress Concern Present (06/24/2024)  Tobacco Use: Medium Risk (06/24/2024)  Health Literacy: Adequate Health Literacy (06/24/2024)   See flowsheets for full screening details  Depression Screen PHQ 2 & 9 Depression Scale- Over the past 2 weeks, how often have you been bothered by any of the following problems? Little interest or pleasure in doing things: 0 Feeling down, depressed, or hopeless (PHQ Adolescent also includes...irritable): 0 PHQ-2 Total Score: 0 Trouble falling or staying asleep, or sleeping too much: 1 Feeling tired or having little energy: 0 Poor appetite or overeating (PHQ Adolescent also includes...weight loss): 0 Feeling bad about yourself - or that you are a failure or have let yourself or your family down: 0 Trouble concentrating on things, such as reading the newspaper or watching television (PHQ Adolescent also includes...like school work): 0 Moving or speaking so slowly that other people could have noticed. Or the opposite - being so fidgety or restless that you have been moving around a lot more than usual: 0 Thoughts that you would be better off dead, or of hurting yourself in some way: 0 PHQ-9 Total Score: 1 If you checked off any problems, how difficult have these problems made it for you to do your work, take care of things at home, or get along with other people?: Not difficult at all  Depression Treatment Depression Interventions/Treatment : Counseling     Goals Addressed               This Visit's Progress     Patient Stated (pt-stated)        To finish exercises              Objective:    Today's Vitals   06/24/24 1558  BP: 130/80  Weight: 176 lb (79.8 kg)  Height: 5' 3.5 (1.613 m)   Body mass index is 30.69 kg/m.  Hearing/Vision screen Hearing Screening - Comments:: No difficulties  Vision  Screening - Comments:: Wears glasses  Immunizations and Health Maintenance Health Maintenance  Topic Date Due   Medicare Annual Wellness (AWV)  05/30/2024   COVID-19 Vaccine (9 - 2025-26 season) 06/20/2026 (Originally 03/25/2024)   DTaP/Tdap/Td (3 - Td or Tdap) 03/23/2026   Pneumococcal Vaccine: 50+ Years  Completed   Influenza Vaccine  Completed   Bone Density Scan  Completed   Zoster Vaccines- Shingrix  Completed   Meningococcal B Vaccine  Aged Out        Assessment/Plan:  This is a routine wellness examination for [place].  Patient Care Team: Vincente Shivers, NP as PCP - General (General Practice)  I have personally reviewed and noted the following in the patient's chart:   Medical and social history Use of alcohol, tobacco or illicit drugs  Current medications and supplements including opioid prescriptions.  Functional ability and status Nutritional status Physical activity Advanced directives List of other physicians Hospitalizations, surgeries, and ER visits in previous 12 months Vitals Screenings to include cognitive, depression, and falls Referrals and appointments  No orders of the defined types were placed in this encounter.  In addition, I have reviewed and discussed with patient certain preventive protocols, quality metrics, and best practice recommendations. A written personalized care plan for preventive services as well as general preventive health recommendations were provided to patient.   Lyle MARLA Right, [place]   06/24/2024   No follow-ups on file.  After Visit Summary: (MyChart) Due to this being a telephonic visit, the after visit summary with patients personalized plan was offered to patient via MyChart   Nurse Notes: nothing to report

## 2025-05-12 ENCOUNTER — Encounter: Admitting: Dermatology

## 2025-06-06 ENCOUNTER — Encounter: Admitting: General Practice
